# Patient Record
Sex: Male | Born: 1965 | Race: White | Hispanic: No | Marital: Single
Health system: Southern US, Community
[De-identification: ages and names within clinical notes are randomized; demographics above are authoritative.]

## PROBLEM LIST (undated history)

## (undated) DIAGNOSIS — Z8709 Personal history of other diseases of the respiratory system: Secondary | ICD-10-CM

## (undated) DIAGNOSIS — K4090 Unilateral inguinal hernia, without obstruction or gangrene, not specified as recurrent: Secondary | ICD-10-CM

## (undated) HISTORY — DX: Personal history of other diseases of the respiratory system: Z87.09

---

## 2006-10-08 ENCOUNTER — Emergency Department (HOSPITAL_COMMUNITY): Admission: EM | Admit: 2006-10-08 | Discharge: 2006-10-09 | Payer: Self-pay | Admitting: Emergency Medicine

## 2010-03-16 ENCOUNTER — Encounter: Payer: Self-pay | Admitting: Orthopedic Surgery

## 2010-12-04 LAB — RAPID URINE DRUG SCREEN, HOSP PERFORMED
Amphetamines: POSITIVE — AB
Barbiturates: NOT DETECTED
Benzodiazepines: POSITIVE — AB
Cocaine: POSITIVE — AB
Opiates: NOT DETECTED
Tetrahydrocannabinol: POSITIVE — AB

## 2010-12-04 LAB — ETHANOL: Alcohol, Ethyl (B): 60 — ABNORMAL HIGH

## 2010-12-04 LAB — BASIC METABOLIC PANEL
BUN: 5 — ABNORMAL LOW
CO2: 18 — ABNORMAL LOW
Calcium: 9.6
Creatinine, Ser: 1.17
GFR calc Af Amer: >60
Glucose, Bld: 87

## 2010-12-04 LAB — URINALYSIS, ROUTINE W REFLEX MICROSCOPIC
Glucose, UA: NEGATIVE
Hgb urine dipstick: NEGATIVE
Protein, ur: NEGATIVE

## 2019-05-31 ENCOUNTER — Ambulatory Visit: Payer: Self-pay | Attending: Internal Medicine

## 2019-05-31 DIAGNOSIS — Z23 Encounter for immunization: Secondary | ICD-10-CM

## 2019-05-31 NOTE — Progress Notes (Signed)
° °  Covid-19 Vaccination Clinic  Name:  Alan Fuentes    MRN: [id number] DOB: December 29, 1965  05/31/2019  Mr. Alan Fuentes was observed post Covid-19 immunization for 15 minutes without incident. He was provided with Vaccine Information Sheet and instruction to access the V-Safe system.   Mr. Alan Fuentes was instructed to call 911 with any severe reactions post vaccine:  Difficulty breathing   Swelling of face and throat   A fast heartbeat   A bad rash all over body   Dizziness and weakness   Immunizations Administered    Name Date Dose VIS Date Route   Pfizer COVID-19 Vaccine 05/31/2019  8:18 AM 0.3 mL 02/02/2019 Intramuscular   Manufacturer: [employer], [employer]   Lot: [id number]   NDC: [address]-5

## 2019-06-25 ENCOUNTER — Ambulatory Visit: Payer: Self-pay | Attending: Internal Medicine

## 2019-06-25 DIAGNOSIS — Z23 Encounter for immunization: Secondary | ICD-10-CM

## 2019-06-25 NOTE — Progress Notes (Signed)
   Covid-19 Vaccination Clinic  Name:  Alan Fuentes    MRN: [id number] DOB: 1965/07/23  06/25/2019  [name] was observed post Covid-19 immunization for 15 minutes without incident. He was provided with Vaccine Information Sheet and instruction to access the V-Safe system.   [name] was instructed to call 911 with any severe reactions post vaccine: Marland Kitchen Difficulty breathing  . Swelling of face and throat  . A fast heartbeat  . A bad rash all over body  . Dizziness and weakness   Immunizations Administered    Name Date Dose VIS Date Route   Pfizer COVID-19 Vaccine 06/25/2019  8:13 AM 0.3 mL 04/18/2018 Intramuscular   Manufacturer: [employer], [employer]   Lot: [id number]   NDC: [address]-4

## 2020-07-12 ENCOUNTER — Encounter (HOSPITAL_COMMUNITY): Payer: Self-pay | Admitting: Emergency Medicine

## 2020-07-12 ENCOUNTER — Emergency Department (HOSPITAL_COMMUNITY)
Admission: EM | Admit: 2020-07-12 | Discharge: 2020-07-13 | Disposition: A | Discharge status: Home / Self Care | Payer: Self-pay | Attending: Emergency Medicine | Admitting: Emergency Medicine

## 2020-07-12 ENCOUNTER — Other Ambulatory Visit: Payer: Self-pay

## 2020-07-12 DIAGNOSIS — R079 Chest pain, unspecified: Secondary | ICD-10-CM

## 2020-07-12 DIAGNOSIS — R000 Tachycardia, unspecified: Secondary | ICD-10-CM | POA: Insufficient documentation

## 2020-07-12 NOTE — ED Triage Notes (Signed)
Pt reports intermittent chest pain over the last 3 weeks. He though it was related to high blood pressure. He started BP meds earlier this week. Still experiencing the pain. States that it sometimes shoots down his L arm.

## 2020-07-13 ENCOUNTER — Emergency Department (HOSPITAL_COMMUNITY): Payer: Self-pay

## 2020-07-13 LAB — CBC
HCT: 41.7 % (ref 39.0–52.0)
Hemoglobin: 14.3 g/dL (ref 13.0–17.0)
MCH: 30.1 pg (ref 26.0–34.0)
MCHC: 34.3 g/dL (ref 30.0–36.0)
MCV: 87.8 fL (ref 80.0–100.0)
Platelets: 232 10*3/uL (ref 150–400)
RBC: 4.75 MIL/uL (ref 4.22–5.81)
RDW: 12.3 % (ref 11.5–15.5)
WBC: 10.4 10*3/uL (ref 4.0–10.5)
nRBC: 0 % (ref 0.0–0.2)

## 2020-07-13 LAB — TROPONIN I (HIGH SENSITIVITY): Troponin I (High Sensitivity): <2 ng/L (ref <18)

## 2020-07-13 LAB — BASIC METABOLIC PANEL
Anion gap: 7 (ref 5–15)
BUN: 12 mg/dL (ref 6–20)
CO2: 27 mmol/L (ref 22–32)
Calcium: 9.1 mg/dL (ref 8.9–10.3)
Chloride: 104 mmol/L (ref 98–111)
Creatinine, Ser: 0.86 mg/dL (ref 0.61–1.24)
GFR, Estimated: >60 mL/min (ref >60)
Glucose, Bld: 113 mg/dL — ABNORMAL HIGH (ref 70–99)
Potassium: 3.9 mmol/L (ref 3.5–5.1)
Sodium: 138 mmol/L (ref 135–145)

## 2020-07-13 LAB — D-DIMER, QUANTITATIVE: D-Dimer, Quant: 0.31 ug/mL-FEU (ref 0.00–0.50)

## 2020-07-13 NOTE — ED Provider Notes (Signed)
[facility]-EMERGENCY DEPT Provider Note   CSN: [id number] Arrival date & time: 07/12/20  2326     History Chief Complaint  Patient presents with  . Chest Pain    Alan Fuentes is a 55 y.o. male.  Patient presents chief complaint of chest pain.  Described as left-sided chest pain sharp in nature and lasts about 3 seconds and then resolves.  Currently denies any chest pain at rest at this time but had it earlier today.  Has had this pain off and on for the past week.  Denies shortness of breath denies palpitations denies diaphoresis.        History reviewed. No pertinent past medical history.  There are no problems to display for this patient.   History reviewed. No pertinent surgical history.     History reviewed. No pertinent family history.     Home Medications Prior to Admission medications   Not on File    Allergies    Patient has no known allergies.  Review of Systems   Review of Systems  Constitutional: Negative for fever.  HENT: Negative for ear pain and sore throat.   Eyes: Negative for pain.  Respiratory: Negative for cough.   Cardiovascular: Positive for chest pain.  Gastrointestinal: Negative for abdominal pain.  Genitourinary: Negative for flank pain.  Musculoskeletal: Negative for back pain.  Skin: Negative for color change and rash.  Neurological: Negative for syncope.  All other systems reviewed and are negative.   Physical Exam Updated Vital Signs BP (!) 149/91   Pulse 93   Temp 98.1 F (36.7 C) (Oral)   Resp 15   Ht 5\' 9"  (1.753 m)   Wt 77.1 kg   SpO2 99%   BMI 25.10 kg/m   Physical Exam Constitutional:      General: He is not in acute distress.    Appearance: He is well-developed.  HENT:     Head: Normocephalic.     Nose: Nose normal.  Eyes:     Extraocular Movements: Extraocular movements intact.  Cardiovascular:     Rate and Rhythm: Tachycardia present.  Pulmonary:     Effort: Pulmonary  effort is normal.  Musculoskeletal:     Right lower leg: No edema.     Left lower leg: No edema.  Skin:    Coloration: Skin is not jaundiced.  Neurological:     Mental Status: He is alert. Mental status is at baseline.     ED Results / Procedures / Treatments   Labs (all labs ordered are listed, but only abnormal results are displayed) Labs Reviewed  BASIC METABOLIC PANEL - Abnormal; Notable for the following components:      Result Value   Glucose, Bld 113 (*)    All other components within normal limits  CBC  D-DIMER, QUANTITATIVE  TROPONIN I (HIGH SENSITIVITY)    EKG EKG Interpretation  Date/Time:  Sunday Jul 13 2020 00:00:14 EDT Ventricular Rate:  115 PR Interval:  114 QRS Duration: 64 QT Interval:  375 QTC Calculation: 519 R Axis:   59 Text Interpretation: Sinus tachycardia Borderline T abnormalities, diffuse leads Prolonged QT interval 12 Lead; Mason-Likar Confirmed by [id number] ([id number]) on 07/13/2020 12:04:33 AM   Radiology DG Chest 2 View  Result Date: 07/13/2020 CLINICAL DATA:  Chest pain x3 weeks. EXAM: CHEST - 2 VIEW COMPARISON:  None. FINDINGS: The heart size and mediastinal contours are within normal limits. Both lungs are clear. Multilevel degenerative changes seen throughout the  thoracic spine. IMPRESSION: No active cardiopulmonary disease. Electronically Signed   By: Aram Candela M.D.   On: 07/13/2020 00:20    Procedures Procedures   Medications Ordered in ED Medications - No data to display  ED Course  I have reviewed the triage vital signs and the nursing notes.  Pertinent labs & imaging results that were available during my care of the patient were reviewed by me and considered in my medical decision making (see chart for details).    MDM Rules/Calculators/A&P                          On exam patient is anxious appearing with slightly pressured speech.  He is mildly tachycardic.  Chest x-ray is unremarkable EKG is unremarkable no ST  elevations no C depressions in sinus rhythm mild tachycardia.  Troponin is negative.  D-dimer is also negative.  Given negative work-up I doubt acute coronary syndrome or other emergent pathology.  Will recommend outpatient follow-up with cardiology within the week, recommending immediate return for difficulty breathing worsening symptoms worsening pain or any additional concerns.  Final Clinical Impression(s) / ED Diagnoses Final diagnoses:  Chest pain, unspecified type    Rx / DC Orders ED Discharge Orders    None       Cheryll Cockayne, MD 07/13/20 [phone]

## 2020-07-13 NOTE — Discharge Instructions (Addendum)
Call your primary care doctor or specialist as discussed in the next 2-3 days.   Return immediately back to the ER if:  Your symptoms worsen within the next 12-24 hours. You develop new symptoms such as new fevers, persistent vomiting, new pain, shortness of breath, or new weakness or numbness, or if you have any other concerns.  

## 2020-07-26 ENCOUNTER — Emergency Department (HOSPITAL_COMMUNITY)
Admission: EM | Admit: 2020-07-26 | Discharge: 2020-07-26 | Disposition: A | Discharge status: Home / Self Care | Payer: Self-pay | Attending: Emergency Medicine | Admitting: Emergency Medicine

## 2020-07-26 ENCOUNTER — Other Ambulatory Visit: Payer: Self-pay

## 2020-07-26 DIAGNOSIS — R002 Palpitations: Secondary | ICD-10-CM | POA: Insufficient documentation

## 2020-07-26 DIAGNOSIS — I10 Essential (primary) hypertension: Secondary | ICD-10-CM | POA: Insufficient documentation

## 2020-07-26 DIAGNOSIS — R9431 Abnormal electrocardiogram [ECG] [EKG]: Secondary | ICD-10-CM | POA: Insufficient documentation

## 2020-07-26 DIAGNOSIS — R000 Tachycardia, unspecified: Secondary | ICD-10-CM | POA: Insufficient documentation

## 2020-07-26 LAB — BASIC METABOLIC PANEL
Anion gap: 7 (ref 5–15)
BUN: 15 mg/dL (ref 6–20)
CO2: 25 mmol/L (ref 22–32)
Calcium: 9.1 mg/dL (ref 8.9–10.3)
Chloride: 105 mmol/L (ref 98–111)
Creatinine, Ser: 0.79 mg/dL (ref 0.61–1.24)
GFR, Estimated: >60 mL/min (ref >60)
Glucose, Bld: 113 mg/dL — ABNORMAL HIGH (ref 70–99)
Potassium: 3.8 mmol/L (ref 3.5–5.1)
Sodium: 137 mmol/L (ref 135–145)

## 2020-07-26 LAB — CBC WITH DIFFERENTIAL/PLATELET
Abs Immature Granulocytes: 0.02 10*3/uL (ref 0.00–0.07)
Basophils Absolute: 0 10*3/uL (ref 0.0–0.1)
Basophils Relative: 0 %
Eosinophils Absolute: 0.1 10*3/uL (ref 0.0–0.5)
Eosinophils Relative: 1 %
HCT: 41.6 % (ref 39.0–52.0)
Hemoglobin: 14.4 g/dL (ref 13.0–17.0)
Immature Granulocytes: 0 %
Lymphocytes Relative: 24 %
Lymphs Abs: 2.2 10*3/uL (ref 0.7–4.0)
MCH: 30.5 pg (ref 26.0–34.0)
MCHC: 34.6 g/dL (ref 30.0–36.0)
MCV: 88.1 fL (ref 80.0–100.0)
Monocytes Absolute: 0.5 10*3/uL (ref 0.1–1.0)
Monocytes Relative: 5 %
Neutro Abs: 6.4 10*3/uL (ref 1.7–7.7)
Neutrophils Relative %: 70 %
Platelets: 223 10*3/uL (ref 150–400)
RBC: 4.72 MIL/uL (ref 4.22–5.81)
RDW: 12.1 % (ref 11.5–15.5)
WBC: 9.2 10*3/uL (ref 4.0–10.5)
nRBC: 0 % (ref 0.0–0.2)

## 2020-07-26 MED ORDER — PROPRANOLOL HCL 10 MG PO TABS: 10.0000 mg | ORAL_TABLET | ORAL | 0 refills | Status: DC | PRN

## 2020-07-26 NOTE — ED Provider Notes (Signed)
[facility]-EMERGENCY DEPT Provider Note   CSN: [id number] Arrival date & time: 07/26/20  1445     History Chief Complaint  Patient presents with  . Tachycardia    Alan Fuentes is a 55 y.o. male.  Alan Fuentes states that he was sitting at his desk today when he suddenly started to feel palpitations.  When he feels his heart rate speeding up, he becomes very nervous and starts to check his pulse frequently.  He tries to do some breathing exercises.  However, he ultimately felt that he needed to be evaluated.  He stood outside the emergency department for several minutes hoping that just the proximity to the ER would help his symptoms.  However, his high heart rate persisted, and he sought evaluation.  He recently had a cardiac work-up for a similar episode that was associated with chest pain.  This episode was not associated with any chest pain.  He has a cardiology appointment, but it is not until August 19.  He states that he also saw a telehealth provider, and he was started on something for his blood pressure.  He does endorse some anxiety, and he has known close friends and family who have died suddenly.  He used to use marijuana but thought it worsened symptoms, and he no longer uses that.  He is physically fit, and he states that he is able to exercise without any difficulty.  The history is provided by the patient.  Palpitations Palpitations quality:  Fast Onset quality:  Sudden Duration: roughly an hour or more. Timing:  Constant Progression:  Resolved Chronicity:  Recurrent Context comment:  Unsure what triggers the feelings Relieved by:  Nothing Worsened by:  Nothing Ineffective treatments:  Breathing exercises Associated symptoms: no back pain, no chest pain, no cough, no diaphoresis, no dizziness, no near-syncope, no shortness of breath and no vomiting        No past medical history on file.  There are no problems to display for this  patient.     No family history on file.     Home Medications Prior to Admission medications   Not on File    Allergies    Patient has no allergy information on record.  Review of Systems   Review of Systems  Constitutional: Negative for chills, diaphoresis and fever.  HENT: Negative for ear pain and sore throat.   Eyes: Negative for pain and visual disturbance.  Respiratory: Negative for cough and shortness of breath.   Cardiovascular: Positive for palpitations. Negative for chest pain and near-syncope.  Gastrointestinal: Negative for abdominal pain and vomiting.  Genitourinary: Negative for dysuria and hematuria.  Musculoskeletal: Negative for arthralgias and back pain.  Skin: Negative for color change and rash.  Neurological: Negative for dizziness, seizures and syncope.  All other systems reviewed and are negative.   Physical Exam Updated Vital Signs BP (!) 158/106 (BP Location: Right Arm)   Pulse 87   Temp 98.6 F (37 C) (Oral)   Resp 17   SpO2 100%   Physical Exam Vitals and nursing note reviewed.  Constitutional:      Appearance: He is well-developed.  HENT:     Head: Normocephalic and atraumatic.  Eyes:     Conjunctiva/sclera: Conjunctivae normal.  Cardiovascular:     Rate and Rhythm: Normal rate and regular rhythm.     Heart sounds: No murmur heard.   Pulmonary:     Effort: Pulmonary effort is normal. No respiratory distress.  Breath sounds: Normal breath sounds.  Musculoskeletal:        General: No deformity.     Cervical back: Neck supple.  Skin:    General: Skin is warm and dry.  Neurological:     General: No focal deficit present.     Mental Status: He is alert.  Psychiatric:        Mood and Affect: Mood normal.        Behavior: Behavior normal.     ED Results / Procedures / Treatments   Labs (all labs ordered are listed, but only abnormal results are displayed) Labs Reviewed  BASIC METABOLIC PANEL - Abnormal; Notable for the  following components:      Result Value   Glucose, Bld 113 (*)    All other components within normal limits  CBC WITH DIFFERENTIAL/PLATELET    EKG EKG Interpretation  Date/Time:  Saturday July 26 2020 14:54:13 EDT Ventricular Rate:  119 PR Interval:  122 QRS Duration: 86 QT Interval:  395 QTC Calculation: 556 R Axis:   56 Text Interpretation: Sinus tachycardia Atrial premature complex Inferior infarct, age indeterminate Lateral leads are also involved Prolonged QT interval 12 Lead; Mason-Likar Confirmed by Alan Fuentes ([id number]) on 07/26/2020 5:03:44 PM   Radiology No results found.  Procedures Procedures   Medications Ordered in ED Medications - No data to display  ED Course  I have reviewed the triage vital signs and the nursing notes.  Pertinent labs & imaging results that were available during my care of the patient were reviewed by me and considered in my medical decision making (see chart for details).    MDM Rules/Calculators/A&P                          Alan Fuentes presented with palpitations.  EKG did reveal sinus tachycardia, but by the time I saw him, his heart rate was in a normal range.  He was hypertensive, and he has just started a blood pressure medication.  I did not order a cardiac work-up because he denied any chest pain.  Furthermore, he had just had an extensive cardiac work-up here.  EKG also revealed prolonged QTc interval, but this was not present on his prior EKG.  I am reassured that he has a cardiac follow-up.  I think there is a possibility this is panic related, but I am also concerned about an intermittent arrhythmia.  I gave him some propranolol to use as needed, and I encouraged him to return if he worsens. Final Clinical Impression(s) / ED Diagnoses Final diagnoses:  Palpitations  Hypertension, unspecified type  Prolonged Q-T interval on ECG    Rx / DC Orders ED Discharge Orders         Ordered    propranolol (INDERAL) 10 MG tablet  As  needed        07/26/20 2034           Alan Distance, MD 07/26/20 2104

## 2020-07-26 NOTE — ED Provider Notes (Signed)
Emergency Medicine Provider Triage Evaluation Note  Alan Fuentes , a 55 y.o. male  was evaluated in triage.  Pt complains of feeling like his heart is racing.  Symptoms have been going on for the past 2 weeks.  He was referred to cardiology but cannot see them for another 6 weeks.  He denies any chest pain or shortness of breath but he is unsure what is causing his high heart rate.  He is unsure if he is anxious or if this is causing him to be anxious.  Denies any leg swelling or history of DVT or PE.  Review of Systems  Positive: Palpitation Negative: Chest pain, shortness of breath  Physical Exam  BP (!) 168/106 (BP Location: Right Arm)   Pulse (!) 119   Temp 98.6 F (37 C) (Oral)   Resp 16   SpO2 100%  Gen:   Awake, no distress   Resp:  Normal effort  MSK:   Moves extremities without difficulty  Other:  Tachycardic  Medical Decision Making  Medically screening exam initiated at 3:18 PM.  Appropriate orders placed.  Alan Fuentes was informed that the remainder of the evaluation will be completed by another provider, this initial triage assessment does not replace that evaluation, and the importance of remaining in the ED until their evaluation is complete.  Will order blood work and   Dietrich Pates, Cordelia Poche 07/26/20 1519    Lorre Nick, MD 07/27/20 [phone]

## 2020-07-26 NOTE — ED Triage Notes (Signed)
Patient reports intermittent increased heart rate for two weeks. He reports being seen here two weeks ago for chest pain/tachycardia and was d/c with a referral for cardiology. He states he was told that it was most likely anxiety. He reports he took half a xanax today which has not helped. He reports checking his HR at home with a device at home frequently. He denies chest pain at this time. He denies ever being diagnosed with anxiety or depression.

## 2020-07-29 ENCOUNTER — Telehealth: Payer: Self-pay | Admitting: *Deleted

## 2020-07-31 ENCOUNTER — Telehealth: Payer: Self-pay | Admitting: Cardiology

## 2020-07-31 NOTE — Telephone Encounter (Signed)
New Message:   Pt was seen in the ER and referred to [place]. He does not have enough medicine until his appt on 7-16. He called the ER and they told him to get a prescription from provider that he was going to see.      *STAT* If patient is at the pharmacy, call can be transferred to refill team.   1. Which medications need to be refilled? (please list name of each medication and dose if known)  a new prescription for Propanolol  2. Which pharmacy/location (including street and city if local pharmacy) is medication to be sent to? [facility] [address]  3. Do they need a 30 day or 90 day supply?  Enough until  his appt on 09-06-20

## 2020-08-01 MED ORDER — PROPRANOLOL HCL 10 MG PO TABS: 10.0000 mg | ORAL_TABLET | Freq: Two times a day (BID) | ORAL | 0 refills | Status: DC | PRN

## 2020-08-01 NOTE — Telephone Encounter (Signed)
Ok to fill 

## 2020-08-01 NOTE — Telephone Encounter (Signed)
Per Dr. Cristal Deer, ok to send new Rx for propanolol 10 mg BID PRN.

## 2020-09-08 NOTE — Progress Notes (Signed)
Cardiology Office Note:    Date:  09/10/2020   ID:  Alan Fuentes, DOB 1965/05/31, MRN [id number]  PCP:  Pcp, No  Cardiologist:  Jodelle Red, MD  Referring MD: Cheryll Cockayne, MD   CC: new patient evaluation, post ER follow up   History of Present Illness:    [name] is a 55 y.o. male with a hx of asthma who is seen as a new consult at the request of [place], Eustace Moore, MD for the evaluation and management of palpitations .  Today: We reviewed his recent ER visits together. He does not endorse chest pain, but does endorse racing heartbeats. This has happened in the past, but recently has been more severe. Notes that stress/anxiety worsens the symptoms, and they have resolved since stopping THC use (smoking and edibles).   Risk factors: -Alcohol: None -Tobacco: prior marijuana use (now abstinent), Never smoked cigarettes -Comorbidities: Asthma -Exercise level: Previously used to box, running. Asks if he is able to resume exercising. -Cardiac ROS: +shortness of breath, no PND, no orthopnea, no LE edema, no syncope -Family history: Father had heart attack at 106 yo, had CABG x3, rheumatic fever as child. Mother had valve replacement a few years ago. Maternal grandmother died of lung cancer, no cardiac issues. Maternal great-grandmother had a stroke at 44 yo. Has siblings with no health issues.  Lately, he is experiencing episodes of racing heart rate/palpitations. Correlating symptoms include shortness of breath, and his palpitations will worsen with stress and anxiety. 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  He had asthma as a child, and therefore waited before first visiting the ED for the palpitations and shortness of breath. Currently he does not take medication for asthma. After visiting the ED, taking propranolol is working well with relieving his palpitations and related symptoms. Occasionally he takes an extra 1/2  tablet.  For hypertension, he was prescribed 25 mg Losartan. At this time he is not taking this. At home, his blood pressure averages 130s-140s and his HR averages in the 70s.  For his diet, he has not eaten meat for the past 1.5 years.   While he was young, he pinched a nerve in his diaphragm. Occasionally he feels pain associated with this.  Previously he was taking an OTC testosterone supplement.  He denies any exertional symptoms, headaches, lightheadedness, or syncope. Also has no lower extremity edema, orthopnea or PND.   Past Medical History:  Diagnosis Date   History of asthma    as a child    History reviewed. No pertinent surgical history.  Current Medications: No current outpatient medications on file prior to visit.   No current facility-administered medications on file prior to visit.     Allergies:   Patient has no known allergies.   Social History   Tobacco Use   Smoking status: Never   Smokeless tobacco: Never  Substance Use Topics   Alcohol use: Never   Drug use: Not Currently    Types: Marijuana    Family History: family history includes Congestive Heart Failure in his father; Heart attack in his father; Lung cancer in his maternal grandmother; Rheumatic fever in his father; Valvular heart disease in his mother.  ROS:   Please see the history of present illness.  Additional pertinent ROS: Constitutional: Negative for chills, fever, night sweats, unintentional weight loss  HENT: Negative for ear pain and hearing loss.   Eyes: Negative for loss  of vision and eye pain.  Respiratory: Positive for shortness of breath. Negative for cough, sputum, wheezing.   Cardiovascular: See HPI. Gastrointestinal: Negative for abdominal pain, melena, and hematochezia.  Genitourinary: Negative for dysuria and hematuria.  Musculoskeletal: Negative for falls and myalgias.  Skin: Negative for itching and rash.  Neurological: Negative for focal weakness, focal sensory  changes and loss of consciousness.  Endo/Heme/Allergies: Does not bruise/bleed easily.     EKGs/Labs/Other Studies Reviewed:    The following studies were reviewed today: No prior CV studies available.  EKG:  EKG is personally reviewed.   09/09/2020: normal sinus rhythm at 82 bpm  Recent Labs: 07/26/2020: BUN 15; Creatinine, Ser 0.79; Hemoglobin 14.4; Platelets 223; Potassium 3.8; Sodium 137  Recent Lipid Panel No results found for: CHOL, TRIG, HDL, CHOLHDL, VLDL, LDLCALC, LDLDIRECT  Physical Exam:    VS:  BP (!) 160/98   Pulse 82   Ht 5\' 9"  (1.753 m)   Wt 173 lb 6.4 oz (78.7 kg)   BMI 25.61 kg/m     Wt Readings from Last 3 Encounters:  09/09/20 173 lb 6.4 oz (78.7 kg)  07/12/20 170 lb (77.1 kg)    GEN: Well nourished, well developed in no acute distress HEENT: Normal, moist mucous membranes NECK: No JVD CARDIAC: regular rhythm, normal S1 and S2, no rubs or gallops. No murmur. VASCULAR: Radial and DP pulses 2+ bilaterally. No carotid bruits RESPIRATORY:  Clear to auscultation without rales, wheezing or rhonchi  ABDOMEN: Soft, non-tender, non-distended MUSCULOSKELETAL:  Ambulates independently SKIN: Warm and dry, no edema NEUROLOGIC:  Alert and oriented x 3. No focal neuro deficits noted. PSYCHIATRIC:  Normal affect    ASSESSMENT:    1. Heart palpitations   2. Essential hypertension   3. Cardiac risk counseling   4. Counseling on health promotion and disease prevention   5. Family history of heart disease   6. Medication management    PLAN:    Palpitations: -now resolved with cessation of THC use -discussed potential triggers, etiologies today -as symptoms have resolved, will hold on monitor/echo for now, he will contact me if they recur -he also started propranolol at the same time he stopped THC, so this may also help his symptoms. However, he would prefer daily dosing. Will change propranolol to metoprolol succinate today  Hypertension: -prescribed losartan  but not taking currently -reports home numbers are better but not ideal -will monitor at home. We are changing beta blocker, but I do not expect significant BP improvement on this. But we will change one medication at a time. Does not currently have insurance, so will aim for low cost generic meds to start if agents needed at follow up  family history of heart disease: -checking lipids, lp(a) today  Cardiac risk counseling and prevention recommendations:  -recommend heart healthy/Mediterranean diet, with whole grains, fruits, vegetable, fish, lean meats, nuts, and olive oil. Limit salt. -recommend moderate walking, 3-5 times/week for 30-50 minutes each session. Aim for at least 150 minutes.week. Goal should be pace of 3 miles/hours, or walking 1.5 miles in 30 minutes -recommend avoidance of tobacco products. Avoid excess alcohol. -ASCVD risk score: The ASCVD Risk score 07/14/20 DC Jr., et al., 2013) failed to calculate for the following reasons:   Cannot find a previous HDL lab   Cannot find a previous total cholesterol lab     Plan for follow up: 2 months or sooner as needed.  2014, MD, PhD, [facility] [place]  [facility]  Medication Adjustments/Labs and Tests Ordered: Current medicines are reviewed at length with the patient today.  Concerns regarding medicines are outlined above.  Orders Placed This Encounter  Procedures   Lipid panel   Lipoprotein A (LPA)   EKG 12-Lead    Meds ordered this encounter  Medications   metoprolol succinate (TOPROL-XL) 25 MG 24 hr tablet    Sig: Take 1 tablet (25 mg total) by mouth daily. Take with or immediately following a meal.    Dispense:  90 tablet    Refill:  3     Patient Instructions  Medication Instructions:  Stop taking Propranolol 10 mg Start taking Metoprolol 25 mg daily  *If you need a refill on your cardiac medications before your next appointment, please call your pharmacy*   Lab Work: Dr. Cristal Deer  recommends lab work at our [facility] ([address] [place], [place] [address], no lab appointment needed)  -Fasting Lipid -LPa    If you have labs (blood work) drawn today and your tests are completely normal, you will receive your results only by: MyChart Message (if you have MyChart) OR A paper copy in the mail If you have any lab test that is abnormal or we need to change your treatment, we will call you to review the results.   Testing/Procedures: None ordered today   Follow-Up: At [facility], you and your health needs are our priority.  As part of our continuing mission to provide you with exceptional heart care, we have created designated Provider Care Teams.  These Care Teams include your primary Cardiologist (physician) and Advanced Practice Providers (APPs -  Physician Assistants and Nurse Practitioners) who all work together to provide you with the care you need, when you need it.  We recommend signing up for the patient portal called "MyChart".  Sign up information is provided on this After Visit Summary.  MyChart is used to connect with patients for Virtual Visits (Telemedicine).  Patients are able to view lab/test results, encounter notes, upcoming appointments, etc.  Non-urgent messages can be sent to your provider as well.   To learn more about what you can do with MyChart, go to [email or web address].    Your next appointment:   2 month(s)  The format for your next appointment:   In Person  Provider:   Jodelle Red, MD     [facility] Stump[place],acting as a scribe for Jodelle Red, MD.,have documented all relevant documentation on the behalf of Jodelle Red, MD,as directed by  Jodelle Red, MD while in the presence of Jodelle Red, MD.  I, Jodelle Red, MD, have reviewed all documentation for this visit. The documentation on 09/10/20 for the exam, diagnosis, procedures, and orders are all accurate and  complete.   Signed, Jodelle Red, MD PhD 09/10/2020 8:25 AM    [facility]

## 2020-09-09 ENCOUNTER — Other Ambulatory Visit: Payer: Self-pay

## 2020-09-09 ENCOUNTER — Encounter (HOSPITAL_BASED_OUTPATIENT_CLINIC_OR_DEPARTMENT_OTHER): Payer: Self-pay | Admitting: Cardiology

## 2020-09-09 ENCOUNTER — Ambulatory Visit (INDEPENDENT_AMBULATORY_CARE_PROVIDER_SITE_OTHER): Payer: Self-pay | Admitting: Cardiology

## 2020-09-09 VITALS — BP 160/98 | HR 82 | Ht 69.0 in | Wt 173.4 lb

## 2020-09-09 DIAGNOSIS — Z8249 Family history of ischemic heart disease and other diseases of the circulatory system: Secondary | ICD-10-CM

## 2020-09-09 DIAGNOSIS — R002 Palpitations: Secondary | ICD-10-CM | POA: Insufficient documentation

## 2020-09-09 DIAGNOSIS — Z7189 Other specified counseling: Secondary | ICD-10-CM

## 2020-09-09 DIAGNOSIS — Z79899 Other long term (current) drug therapy: Secondary | ICD-10-CM

## 2020-09-09 DIAGNOSIS — I10 Essential (primary) hypertension: Secondary | ICD-10-CM | POA: Insufficient documentation

## 2020-09-09 MED ORDER — METOPROLOL SUCCINATE ER 25 MG PO TB24: 25.0000 mg | ORAL_TABLET | Freq: Every day | ORAL | 3 refills | Status: DC

## 2020-09-09 NOTE — Patient Instructions (Signed)
Medication Instructions:  Stop taking Propranolol 10 mg Start taking Metoprolol 25 mg daily  *If you need a refill on your cardiac medications before your next appointment, please call your pharmacy*   Lab Work: Dr. Cristal Deer recommends lab work at our [facility] ([address], [place] [address], no lab appointment needed)  -Fasting Lipid -LPa    If you have labs (blood work) drawn today and your tests are completely normal, you will receive your results only by: MyChart Message (if you have MyChart) OR A paper copy in the mail If you have any lab test that is abnormal or we need to change your treatment, we will call you to review the results.   Testing/Procedures: None ordered today   Follow-Up: At [facility], you and your health needs are our priority.  As part of our continuing mission to provide you with exceptional heart care, we have created designated Provider Care Teams.  These Care Teams include your primary Cardiologist (physician) and Advanced Practice Providers (APPs -  Physician Assistants and Nurse Practitioners) who all work together to provide you with the care you need, when you need it.  We recommend signing up for the patient portal called "MyChart".  Sign up information is provided on this After Visit Summary.  MyChart is used to connect with patients for Virtual Visits (Telemedicine).  Patients are able to view lab/test results, encounter notes, upcoming appointments, etc.  Non-urgent messages can be sent to your provider as well.   To learn more about what you can do with MyChart, go to [email or web address].    Your next appointment:   2 month(s)  The format for your next appointment:   In Person  Provider:   Jodelle Red, MD

## 2020-09-15 ENCOUNTER — Encounter (HOSPITAL_BASED_OUTPATIENT_CLINIC_OR_DEPARTMENT_OTHER): Payer: Self-pay

## 2020-09-16 ENCOUNTER — Other Ambulatory Visit: Payer: Self-pay | Admitting: Cardiology

## 2020-09-18 LAB — LIPID PANEL
Chol/HDL Ratio: 4.9 ratio (ref 0.0–5.0)
Cholesterol, Total: 205 mg/dL — ABNORMAL HIGH (ref 100–199)
HDL: 42 mg/dL (ref >39)
LDL Chol Calc (NIH): 138 mg/dL — ABNORMAL HIGH (ref 0–99)
Triglycerides: 140 mg/dL (ref 0–149)
VLDL Cholesterol Cal: 25 mg/dL (ref 5–40)

## 2020-09-18 LAB — LIPOPROTEIN A (LPA): Lipoprotein (a): 18.1 nmol/L (ref <75.0)

## 2020-09-30 ENCOUNTER — Encounter (HOSPITAL_BASED_OUTPATIENT_CLINIC_OR_DEPARTMENT_OTHER): Payer: Self-pay

## 2020-11-10 NOTE — Progress Notes (Signed)
Cardiology Office Note:    Date:  11/11/2020   ID:  Alan Fuentes, DOB July 28, 1965, MRN [id number]  PCP:  Pcp, No  Cardiologist:  Buford Dresser, MD  CC: follow up   History of Present Illness:    Alan Fuentes is a 55 y.o. male with a hx of asthma who is seen for follow-up. I initially met him 09/09/2020 for the evaluation and management of palpitations .  Today: He appears well and presents a blood pressure log, beginning with one reading a day and then two daily. On average his readings have been in the 100s/60-70s when he wakes up. He has started drinking beet juice, and believes this has helped drop his blood pressure by 10 points.  Typically when his heart rate begins to rise above 120 bpm he will slow down while running. When he first ran 2 miles, his heart rate did not slow as quickly as usual, but this resolved quickly. This did not recur. He only experiences shortness of breath with heavy exertion, which he expects.  For exercise he is running every day. Now he has worked up to 2 miles. For his diet, he has successfully cut back on candy. He continues to drink soda regularly but plans to work on this. Also he is eating Magic Spoon cereal, which is high-protein and low-carb.  If he sleeps on his back, he endorses sensation of apnea due to a deviated septum. He sleeps normally if on his side. Asking if OTC sleep monitor is helpful for detection/treatment of apnea.  He denies any palpitations, or chest pain. No lightheadedness, headaches, syncope, or PND. Also has no lower extremity edema.   Past Medical History:  Diagnosis Date   History of asthma    as a child    History reviewed. No pertinent surgical history.  Current Medications: Current Outpatient Medications on File Prior to Visit  Medication Sig   metoprolol succinate (TOPROL-XL) 25 MG 24 hr tablet Take 1 tablet (25 mg total) by mouth daily. Take with or immediately following a meal.   No current  facility-administered medications on file prior to visit.     Allergies:   Patient has no known allergies.   Social History   Tobacco Use   Smoking status: Never   Smokeless tobacco: Never  Substance Use Topics   Alcohol use: Never   Drug use: Not Currently    Types: Marijuana    Family History: family history includes Congestive Heart Failure in his father; Heart attack in his father; Lung cancer in his maternal grandmother; Rheumatic fever in his father; Valvular heart disease in his mother. Father had heart attack at 23 yo, had CABG x3, rheumatic fever as child. Mother had valve replacement a few years ago. Maternal grandmother died of lung cancer, no cardiac issues. Maternal great-grandmother had a stroke at 39 yo. Has siblings with no health issues.  ROS:   Please see the history of present illness. All other systems are reviewed and negative.    EKGs/Labs/Other Studies Reviewed:    The following studies were reviewed today: No prior CV studies available.  EKG:  EKG is personally reviewed.   11/11/2020: not ordered 09/09/2020: normal sinus rhythm at 82 bpm  Recent Labs: 07/26/2020: BUN 15; Creatinine, Ser 0.79; Hemoglobin 14.4; Platelets 223; Potassium 3.8; Sodium 137  Recent Lipid Panel    Component Value Date/Time   CHOL 205 (H) 09/17/2020 0820   TRIG 140 09/17/2020 0820   HDL 42 09/17/2020 0820  CHOLHDL 4.9 09/17/2020 0820   LDLCALC 138 (H) 09/17/2020 0820    Physical Exam:    VS:  BP 136/86 (BP Location: Right Arm, Patient Position: Sitting, Cuff Size: Normal)   Pulse 65   Ht 5' 9"  (1.753 m)   Wt 173 lb 6.4 oz (78.7 kg)   SpO2 99%   BMI 25.61 kg/m     Wt Readings from Last 3 Encounters:  11/11/20 173 lb 6.4 oz (78.7 kg)  09/09/20 173 lb 6.4 oz (78.7 kg)  07/12/20 170 lb (77.1 kg)    GEN: Well nourished, well developed in no acute distress HEENT: Normal, moist mucous membranes NECK: No JVD CARDIAC: regular rhythm, normal S1 and S2, no rubs or  gallops. No murmur. VASCULAR: Radial and DP pulses 2+ bilaterally. No carotid bruits RESPIRATORY:  Clear to auscultation without rales, wheezing or rhonchi  ABDOMEN: Soft, non-tender, non-distended MUSCULOSKELETAL:  Ambulates independently SKIN: Warm and dry, no edema NEUROLOGIC:  Alert and oriented x 3. No focal neuro deficits noted. PSYCHIATRIC:  Normal affect    ASSESSMENT:    1. Medication management   2. Heart palpitations   3. Family history of heart disease   4. Cardiac risk counseling   5. Counseling on health promotion and disease prevention   6. 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 Wants to recheck lipids given changes before discussing treatment options. Not fasting today, will order fasting lipids.  Cardiac risk counseling and prevention recommendations:  -recommend heart healthy/Mediterranean diet, with whole grains, fruits, vegetable, fish, lean meats, nuts, and olive oil. Limit salt. -recommend moderate walking, 3-5 times/week for 30-50 minutes each session. Aim for at least 150 minutes.week. Goal should be pace of 3 miles/hours, or walking 1.5 miles in 30 minutes -recommend avoidance of tobacco products. Avoid excess alcohol. -ASCVD risk score: The 10-year ASCVD risk score (Arnett DK, et al., 2019) is: 10.2%   Values used to calculate the score:     Age: 68 years     Sex: Male     Is Non-Hispanic African American: No     Diabetic: No     Tobacco smoker: No     Systolic Blood Pressure: 734 mmHg     Is BP treated:  Yes     HDL Cholesterol: 42 mg/dL     Total Cholesterol: 205 mg/dL     Plan for follow up: 1 year or sooner as needed.  Buford Dresser, MD, PhD, [facility]    Medication Adjustments/Labs and Tests Ordered: Current medicines are reviewed at length with the patient today.  Concerns regarding medicines are outlined above.   Orders Placed This Encounter  Procedures   Lipid panel    No orders of the defined types were placed in this encounter.  Patient Instructions  Medication Instructions:  Your Physician recommend you continue on your current medication as directed.    *If you need a refill on your cardiac medications before your next appointment, please call your pharmacy*   Lab Work: Your provider has recommended lab work. Please have this collected  at [facility] at [place]. The lab is open 8:00 am - 4:30 pm. Please avoid 12:00p - 1:00p for lunch hour. You do not need an appointment. Please go to [phone] [place] [employer], [place] [address]. This is in the Primary Care office on the 3rd floor, let them know you are there for blood work and they will direct you to the lab.  -fasting lipid   If you have labs (blood work) drawn today and your tests are completely normal, you will receive your results only by: [place] (if you have MyChart) OR A paper copy in the mail If you have any lab test that is abnormal or we need to change your treatment, we will call you to review the results.   Testing/Procedures: None ordered today   Follow-Up: At [facility], you and your health needs are our priority.  As part of our continuing mission to provide you with exceptional heart care, we have created designated Provider Care Teams.  These Care Teams include your primary Cardiologist (physician) and Advanced Practice Providers (APPs -  Physician Assistants and Nurse Practitioners) who all work together to provide you with the  care you need, when you need it.  We recommend signing up for the patient portal called "MyChart".  Sign up information is provided on this After Visit Summary.  MyChart is used to connect with patients for Virtual Visits (Telemedicine).  Patients are able to view lab/test results, encounter notes, upcoming appointments, etc.  Non-urgent messages can be sent to your provider as well.   To learn more about what you can do with MyChart, go to [email or web address].    Your next appointment:   1 year(s)  The format for your next appointment:   In Person  Provider:   Buford Dresser, MD     [facility] Stumpf,acting as a scribe for Buford Dresser, MD.,have documented all relevant documentation on the behalf of Buford Dresser, MD,as directed by  Buford Dresser, MD while in the presence of Buford Dresser, MD.  I, Buford Dresser, MD, have reviewed all documentation for this visit. The documentation on 11/11/20 for the exam, diagnosis, procedures, and orders are all accurate and complete.   Signed, Buford Dresser, MD PhD 11/11/2020 8:25 AM    [place]

## 2020-11-11 ENCOUNTER — Ambulatory Visit (INDEPENDENT_AMBULATORY_CARE_PROVIDER_SITE_OTHER): Payer: Self-pay | Admitting: Cardiology

## 2020-11-11 ENCOUNTER — Encounter (HOSPITAL_BASED_OUTPATIENT_CLINIC_OR_DEPARTMENT_OTHER): Payer: Self-pay | Admitting: Cardiology

## 2020-11-11 ENCOUNTER — Other Ambulatory Visit: Payer: Self-pay

## 2020-11-11 VITALS — BP 136/86 | HR 65 | Ht 69.0 in | Wt 173.4 lb

## 2020-11-11 DIAGNOSIS — Z8249 Family history of ischemic heart disease and other diseases of the circulatory system: Secondary | ICD-10-CM

## 2020-11-11 DIAGNOSIS — Z7189 Other specified counseling: Secondary | ICD-10-CM

## 2020-11-11 DIAGNOSIS — R002 Palpitations: Secondary | ICD-10-CM

## 2020-11-11 DIAGNOSIS — Z79899 Other long term (current) drug therapy: Secondary | ICD-10-CM

## 2020-11-11 DIAGNOSIS — R030 Elevated blood-pressure reading, without diagnosis of hypertension: Secondary | ICD-10-CM

## 2020-11-11 NOTE — Patient Instructions (Signed)
Medication Instructions:  Your Physician recommend you continue on your current medication as directed.    *If you need a refill on your cardiac medications before your next appointment, please call your pharmacy*   Lab Work: Your provider has recommended lab work. Please have this collected at [facility] at [place]. The lab is open 8:00 am - 4:30 pm. Please avoid 12:00p - 1:00p for lunch hour. You do not need an appointment. Please go to [address] [place], [place] [address]. This is in the Primary Care office on the 3rd floor, let them know you are there for blood work and they will direct you to the lab.  -fasting lipid   If you have labs (blood work) drawn today and your tests are completely normal, you will receive your results only by: MyChart Message (if you have MyChart) OR A paper copy in the mail If you have any lab test that is abnormal or we need to change your treatment, we will call you to review the results.   Testing/Procedures: None ordered today   Follow-Up: At [facility], you and your health needs are our priority.  As part of our continuing mission to provide you with exceptional heart care, we have created designated Provider Care Teams.  These Care Teams include your primary Cardiologist (physician) and Advanced Practice Providers (APPs -  Physician Assistants and Nurse Practitioners) who all work together to provide you with the care you need, when you need it.  We recommend signing up for the patient portal called "MyChart".  Sign up information is provided on this After Visit Summary.  MyChart is used to connect with patients for Virtual Visits (Telemedicine).  Patients are able to view lab/test results, encounter notes, upcoming appointments, etc.  Non-urgent messages can be sent to your provider as well.   To learn more about what you can do with MyChart, go to [email or web address].    Your next appointment:   1  year(s)  The format for your next appointment:   In Person  Provider:   Jodelle Red, MD

## 2020-11-14 ENCOUNTER — Encounter (HOSPITAL_BASED_OUTPATIENT_CLINIC_OR_DEPARTMENT_OTHER): Payer: Self-pay

## 2020-11-16 ENCOUNTER — Encounter (HOSPITAL_BASED_OUTPATIENT_CLINIC_OR_DEPARTMENT_OTHER): Payer: Self-pay

## 2020-11-17 ENCOUNTER — Telehealth: Payer: Self-pay | Admitting: Cardiology

## 2020-11-17 NOTE — Telephone Encounter (Signed)
Pt c/o BP issue: STAT if pt c/o blurred vision, one-sided weakness or slurred speech  1. What are your last 5 BP readings? 144/88 and pulse upper eighties- have not taken it today  2. Are you having any other symptoms (ex. Dizziness, headache, blurred vision, passed out)? Shaky, rapid heart rate, not att this time  3. What is your BP issue? High blood pressure for him- wanted to be seen- I made an appointment for Wednesday(11-19-20)- please call to advise

## 2020-11-17 NOTE — Telephone Encounter (Signed)
See duplicate message.  ?

## 2020-11-17 NOTE — Telephone Encounter (Signed)
Called patient to verify appointment with Hubbard Hartshorn, NP on Wednesday 9/28 at our [facility] location. Patient verbalized agreement and thanked me for the clarification as he had not read the MyChart message correctly and assumed he had an appointment tomorrow. He expressed gratitude for the call.

## 2020-11-19 ENCOUNTER — Other Ambulatory Visit: Payer: Self-pay

## 2020-11-19 ENCOUNTER — Ambulatory Visit (INDEPENDENT_AMBULATORY_CARE_PROVIDER_SITE_OTHER): Payer: Self-pay | Admitting: Family

## 2020-11-19 ENCOUNTER — Encounter (HOSPITAL_BASED_OUTPATIENT_CLINIC_OR_DEPARTMENT_OTHER): Payer: Self-pay | Admitting: Family

## 2020-11-19 VITALS — BP 136/80 | HR 73 | Ht 69.0 in | Wt 171.0 lb

## 2020-11-19 DIAGNOSIS — Z8249 Family history of ischemic heart disease and other diseases of the circulatory system: Secondary | ICD-10-CM

## 2020-11-19 DIAGNOSIS — R030 Elevated blood-pressure reading, without diagnosis of hypertension: Secondary | ICD-10-CM

## 2020-11-19 DIAGNOSIS — R002 Palpitations: Secondary | ICD-10-CM

## 2020-11-19 DIAGNOSIS — Z79899 Other long term (current) drug therapy: Secondary | ICD-10-CM

## 2020-11-19 MED ORDER — PROPRANOLOL HCL 20 MG PO TABS: 20.0000 mg | ORAL_TABLET | Freq: Three times a day (TID) | ORAL | 2 refills | Status: DC | PRN

## 2020-11-19 NOTE — Progress Notes (Signed)
Office Visit    Patient Name: Dierks Wach Date of Encounter: 11/19/2020  PCP:  Aviva Kluver   [facility]  Cardiologist:  Jodelle Red, MD  Advanced Practice Provider:  No care team member to display Electrophysiologist:  None      Chief Complaint    Kohei Antonellis is a 55 y.o. male with a hx of palpitations, asthma, whitecoat hypertension presents today for dizziness  Past Medical History    Past Medical History:  Diagnosis Date   History of asthma    as a child   No past surgical history on file.  Allergies  No Known Allergies  History of Present Illness    Calab Sachse is a 55 y.o. male with a hx of palpitations, asthma, whitecoat hypertension last seen 11/11/2020.  He was initially evaluated July 2022 for palpitations by Dr. Cristal Deer.  When evaluated at that time his palpitations had stopped when he stopped THC usage and no further evaluation was recommended. He was most recently seen 11/11/2020 reporting home blood pressure readings 100s/60-70s.  He was running daily for exercise and noted that when his heart began to rise above 120 bpm he would slow down.  He was up to 2 miles.  He noted sensation of apnea only if laying on his back due to deviated septum.  He was recommended to continue present dose of metoprolol.  He presents today for follow-up. Reports feeling well after recent visit until Sunday. Tells me Sunday his blood pressure 140/85 and heart rate 85. Tells me his resting heart rate is usually in the 60s. Tells me when this happens he feels nervous. Tells me it got so bad on Sunday that he had sensation of adrenaline rush and feeling panicky and having muscle spasms. Tells me it felt like when he previously was getting ready for a boxing match. He took an additional tablet of the Toprol with improvement in symptoms. During episode reports some chest and neck discomfort. Does note he had a family friend recently pass away suddenly  at 20 years old and wonders if stress might be contributory.   Drinks 6-7 diet sodas which he recently cut out as well as cutting out Splenda from his smoothies. Wonders if his beet juice is bothering his allergies as beets are grown int he ground and he has environmental allergies. Works in crawl spaces doing manual labor and also runs regularly for exercise.   EKGs/Labs/Other Studies Reviewed:   The following studies were reviewed today:   EKG:  EKG is ordered today.  The ekg ordered today demonstrates NSR 73 bpm with no acute ST/T wave changes.   Recent Labs: 07/26/2020: BUN 15; Creatinine, Ser 0.79; Hemoglobin 14.4; Platelets 223; Potassium 3.8; Sodium 137  Recent Lipid Panel    Component Value Date/Time   CHOL 205 (H) 09/17/2020 0820   TRIG 140 09/17/2020 0820   HDL 42 09/17/2020 0820   CHOLHDL 4.9 09/17/2020 0820   LDLCALC 138 (H) 09/17/2020 0820   Home Medications   Current Meds  Medication Sig   metoprolol succinate (TOPROL-XL) 25 MG 24 hr tablet Take 1 tablet (25 mg total) by mouth daily. Take with or immediately following a meal.     Review of Systems     All other systems reviewed and are otherwise negative except as noted above.  Physical Exam    VS:  BP (!) 160/80 (BP Location: Left Arm, Patient Position: Sitting, Cuff Size: Normal)   Pulse 73  Ht 5\' 9"  (1.753 m)   Wt 171 lb (77.6 kg)   SpO2 98%   BMI 25.25 kg/m  , BMI Body mass index is 25.25 kg/m.  Wt Readings from Last 3 Encounters:  11/19/20 171 lb (77.6 kg)  11/11/20 173 lb 6.4 oz (78.7 kg)  09/09/20 173 lb 6.4 oz (78.7 kg)     GEN: Well nourished, well developed, in no acute distress. HEENT: normal. Neck: Supple, no JVD, carotid bruits, or masses. Cardiac: RRR, no murmurs, rubs, or gallops. No clubbing, cyanosis, edema.  Radials/PT 2+ and equal bilaterally.  Respiratory:  Respirations regular and unlabored, clear to auscultation bilaterally. GI: Soft, nontender, nondistended. MS: No deformity  or atrophy. Skin: Warm and dry, no rash. Neuro:  Strength and sensation are intact. Psych: Normal affect.  Assessment & Plan    Palpitations - EKG today shows NSR with no acute ST/T wave changes and no arrhythmias. Reports sensation of palpitations with HR  >80 bpm as resting HR in the 60s. Had elevated heart rate over the weekend which resolved with additional Toprol. Likely etiology anxiety. Will continue Toprol 25mg  QD and start Propranolol 20mg  TID PRN for palpitations. He has cut out soda and was congratulated.  HTN - BP elevated in clinic today though controlled at home. Improved in clinic from 160/80 to 136/80. Known white coat hypertension. Anticipate recently elevated readings were in setting of anxiety. He is agreeable to continue home monitoring and will report BP persistently >130/80.   Family history of heart disease - Lp(a) unremarkable. Continue primary prevention through diet and exercise. Given recent episode of neck tightness and and 10 year ASCVD risk 11.%. Plan for coronary calcium scoring. If elevated calcium score will need to readdress lipid management, he has repeat lipid panel upcoming.   Disposition: Follow up in 2 month(s) with Dr. 09/11/20 or APP.  Signed, , NP 11/19/2020, 8:20 AM [facility]

## 2020-11-19 NOTE — Patient Instructions (Signed)
Medication Instructions:  Your physician has recommended you make the following change in your medication:   START Propranolol one 20mg  tablet as needed for breakthrough palpitations.   *If you need a refill on your cardiac medications before your next appointment, please call your pharmacy*  Lab Work: None ordered today.   Testing/Procedures: Your EKG today shows normal sinus rhythm which is a good result.   Follow-Up: At [facility], you and your health needs are our priority.  As part of our continuing mission to provide you with exceptional heart care, we have created designated Provider Care Teams.  These Care Teams include your primary Cardiologist (physician) and Advanced Practice Providers (APPs -  Physician Assistants and Nurse Practitioners) who all work together to provide you with the care you need, when you need it.  We recommend signing up for the patient portal called "MyChart".  Sign up information is provided on this After Visit Summary.  MyChart is used to connect with patients for Virtual Visits (Telemedicine).  Patients are able to view lab/test results, encounter notes, upcoming appointments, etc.  Non-urgent messages can be sent to your provider as well.   To learn more about what you can do with MyChart, go to [facility].    Your next appointment:   2 month(s)  The format for your next appointment:   In Person  Provider:   [email or web address], MD or Jodelle Red, NP    Other Instructions  To prevent palpitations: Make sure you are adequately hydrated.  Avoid and/or limit caffeine containing beverages like soda or tea. Exercise regularly.  Manage stress well. Some over the counter medications can cause palpitations such as Benadryl, AdvilPM, TylenolPM. Regular Advil or Tylenol do not cause palpitations.    Heart Healthy Diet Recommendations: A low-salt diet is recommended. Meats should be grilled, baked, or boiled. Avoid fried foods.  Focus on lean protein sources like fish or chicken with vegetables and fruits. The American Heart Association is a Alver Sorrow!    Exercise recommendations: The American Heart Association recommends 150 minutes of moderate intensity exercise weekly. Try 30 minutes of moderate intensity exercise 4-5 times per week. This could include walking, jogging, or swimming.

## 2020-12-02 ENCOUNTER — Other Ambulatory Visit: Payer: Self-pay

## 2020-12-02 ENCOUNTER — Ambulatory Visit (INDEPENDENT_AMBULATORY_CARE_PROVIDER_SITE_OTHER)
Admission: RE | Admit: 2020-12-02 | Discharge: 2020-12-02 | Disposition: A | Discharge status: Home / Self Care | Payer: Self-pay | Source: Ambulatory Visit | Attending: Family | Admitting: Family

## 2020-12-02 ENCOUNTER — Encounter (HOSPITAL_BASED_OUTPATIENT_CLINIC_OR_DEPARTMENT_OTHER): Payer: Self-pay

## 2020-12-02 DIAGNOSIS — Z8249 Family history of ischemic heart disease and other diseases of the circulatory system: Secondary | ICD-10-CM

## 2020-12-02 DIAGNOSIS — R002 Palpitations: Secondary | ICD-10-CM

## 2020-12-03 ENCOUNTER — Other Ambulatory Visit: Payer: Self-pay

## 2020-12-03 MED ORDER — ROSUVASTATIN CALCIUM 20 MG PO TABS: 20.0000 mg | ORAL_TABLET | Freq: Every day | ORAL | 4 refills | Status: DC

## 2020-12-03 MED ORDER — ASPIRIN EC 81 MG PO TBEC: 81.0000 mg | DELAYED_RELEASE_TABLET | Freq: Every day | ORAL | 3 refills | Status: AC

## 2020-12-21 ENCOUNTER — Other Ambulatory Visit (HOSPITAL_BASED_OUTPATIENT_CLINIC_OR_DEPARTMENT_OTHER): Payer: Self-pay | Admitting: Family

## 2020-12-21 DIAGNOSIS — Z79899 Other long term (current) drug therapy: Secondary | ICD-10-CM

## 2020-12-21 DIAGNOSIS — R002 Palpitations: Secondary | ICD-10-CM

## 2021-01-08 LAB — LIPID PANEL
Chol/HDL Ratio: 3 ratio (ref 0.0–5.0)
Cholesterol, Total: 75 mg/dL — ABNORMAL LOW (ref 100–199)
HDL: 25 mg/dL — ABNORMAL LOW (ref >39)
LDL Chol Calc (NIH): 34 mg/dL (ref 0–99)
Triglycerides: 74 mg/dL (ref 0–149)
VLDL Cholesterol Cal: 16 mg/dL (ref 5–40)

## 2021-01-12 ENCOUNTER — Encounter (HOSPITAL_BASED_OUTPATIENT_CLINIC_OR_DEPARTMENT_OTHER): Payer: Self-pay

## 2021-01-12 NOTE — Telephone Encounter (Signed)
FYI

## 2021-01-14 ENCOUNTER — Ambulatory Visit (HOSPITAL_BASED_OUTPATIENT_CLINIC_OR_DEPARTMENT_OTHER): Payer: Self-pay | Admitting: Cardiology

## 2021-04-27 ENCOUNTER — Other Ambulatory Visit (HOSPITAL_BASED_OUTPATIENT_CLINIC_OR_DEPARTMENT_OTHER): Payer: Self-pay | Admitting: Family

## 2021-04-27 DIAGNOSIS — R002 Palpitations: Secondary | ICD-10-CM

## 2021-04-27 DIAGNOSIS — Z79899 Other long term (current) drug therapy: Secondary | ICD-10-CM

## 2021-04-27 NOTE — Telephone Encounter (Signed)
Rx(s) sent to pharmacy electronically.  

## 2021-04-30 ENCOUNTER — Other Ambulatory Visit: Payer: Self-pay

## 2021-04-30 ENCOUNTER — Encounter (HOSPITAL_BASED_OUTPATIENT_CLINIC_OR_DEPARTMENT_OTHER): Payer: Self-pay | Admitting: Cardiology

## 2021-04-30 ENCOUNTER — Ambulatory Visit (INDEPENDENT_AMBULATORY_CARE_PROVIDER_SITE_OTHER): Payer: Self-pay | Admitting: Cardiology

## 2021-04-30 VITALS — BP 120/84 | HR 72 | Ht 69.0 in | Wt 169.8 lb

## 2021-04-30 DIAGNOSIS — Z79899 Other long term (current) drug therapy: Secondary | ICD-10-CM

## 2021-04-30 DIAGNOSIS — Z7189 Other specified counseling: Secondary | ICD-10-CM

## 2021-04-30 DIAGNOSIS — Z8249 Family history of ischemic heart disease and other diseases of the circulatory system: Secondary | ICD-10-CM

## 2021-04-30 DIAGNOSIS — R002 Palpitations: Secondary | ICD-10-CM

## 2021-04-30 DIAGNOSIS — I2510 Atherosclerotic heart disease of native coronary artery without angina pectoris: Secondary | ICD-10-CM

## 2021-04-30 DIAGNOSIS — R030 Elevated blood-pressure reading, without diagnosis of hypertension: Secondary | ICD-10-CM

## 2021-04-30 MED ORDER — PROPRANOLOL HCL 20 MG PO TABS: 20.0000 mg | ORAL_TABLET | Freq: Three times a day (TID) | ORAL | 11 refills | Status: DC | PRN

## 2021-04-30 MED ORDER — ROSUVASTATIN CALCIUM 20 MG PO TABS: 20.0000 mg | ORAL_TABLET | Freq: Every day | ORAL | 3 refills | Status: DC

## 2021-04-30 NOTE — Patient Instructions (Signed)
Medication Instructions:  ?Your Physician recommend you continue on your current medication as directed.   ? ?*If you need a refill on your cardiac medications before your next appointment, please call your pharmacy* ? ? ?Lab Work: ?Your provider has recommended lab work in 1 year (Lipid). Please have this collected at [facility] at [place]. The lab is open 8:00 am - 4:30 pm. Please avoid 12:00p - 1:00p for lunch hour. You do not need an appointment. Please go to [address] [place], [place] [address]. This is in the Primary Care office on the 3rd floor, let them know you are there for blood work and they will direct you to the lab. ? ?If you have labs (blood work) drawn today and your tests are completely normal, you will receive your results only by: ?MyChart Message (if you have MyChart) OR ?A paper copy in the mail ?If you have any lab test that is abnormal or we need to change your treatment, we will call you to review the results. ? ? ?Testing/Procedures: ?None ordered today ? ? ?Follow-Up: ?At [facility], you and your health needs are our priority.  As part of our continuing mission to provide you with exceptional heart care, we have created designated Provider Care Teams.  These Care Teams include your primary Cardiologist (physician) and Advanced Practice Providers (APPs -  Physician Assistants and Nurse Practitioners) who all work together to provide you with the care you need, when you need it. ? ?We recommend signing up for the patient portal called "MyChart".  Sign up information is provided on this After Visit Summary.  MyChart is used to connect with patients for Virtual Visits (Telemedicine).  Patients are able to view lab/test results, encounter notes, upcoming appointments, etc.  Non-urgent messages can be sent to your provider as well.   ?To learn more about what you can do with MyChart, go to [email or web address].   ? ?Your next appointment:   ?1  year(s) ? ?The format for your next appointment:   ?In Person ? ?Provider:   ?Buford Dresser, MD{ ? ? ? ?

## 2021-04-30 NOTE — Progress Notes (Signed)
Cardiology Office Note:    Date:  04/30/2021   ID:  Alan Fuentes, DOB 1965/03/20, MRN [id number]  PCP:  Pcp, No  Cardiologist:  Buford Dresser, MD  CC: follow up  History of Present Illness:    Alan Fuentes is a 56 y.o. male with a hx of asthma who is seen for follow-up. I initially met him 09/09/2020 for the evaluation and management of palpitations.  Today: Overall, he appears well. He presents a blood pressure log, with readings a couple times a week. Generally well controlled at home. He has known white coat syndrome in clinic.  Every once in a while he experiences chest tightness, but this is mild and he wonders if this is due to poor posture at times.  Lately his exercise consists of using an indoor bike for 30 minutes a day. He denies issues with his heart rate or palpitations. He has been cutting his PRN propranolol into fragments and taking them a little at a time. This seems to help control his symptoms well.  Since his last appointment he has started a plant based diet, and eliminated dairy products for about a month now. Since then he notices significant improvement in his sleep apnea. He continues with daily beet juice. Most recent LDL 34.  He denies any shortness of breath, or peripheral edema. No lightheadedness, headaches, syncope, orthopnea, or PND.   Past Medical History:  Diagnosis Date   History of asthma    as a child    History reviewed. No pertinent surgical history.  Current Medications: Current Outpatient Medications on File Prior to Visit  Medication Sig   aspirin EC 81 MG tablet Take 1 tablet (81 mg total) by mouth daily. Swallow whole.   metoprolol succinate (TOPROL-XL) 25 MG 24 hr tablet Take 1 tablet (25 mg total) by mouth daily. Take with or immediately following a meal.   No current facility-administered medications on file prior to visit.     Allergies:   Patient has no known allergies.   Social History   Tobacco Use   Smoking  status: Never   Smokeless tobacco: Never  Substance Use Topics   Alcohol use: Never   Drug use: Not Currently    Types: Marijuana    Family History: family history includes Congestive Heart Failure in his father; Heart attack in his father; Lung cancer in his maternal grandmother; Rheumatic fever in his father; Valvular heart disease in his mother. Father had heart attack at 48 yo, had CABG x3, rheumatic fever as child. Mother had valve replacement a few years ago. Maternal grandmother died of lung cancer, no cardiac issues. Maternal great-grandmother had a stroke at 54 yo. Has siblings with no health issues.  ROS:   Please see the history of present illness. (+) Mild chest tightness All other systems are reviewed and negative.    EKGs/Labs/Other Studies Reviewed:    The following studies were reviewed today:  CT Calcium Score 12/02/2020: FINDINGS: Non-cardiac: No significant non cardiac findings on limited lung and soft tissue windows. See separate report from [facility].   Ascending Aorta: Normal diameter 3.4 cm   Pericardium: Normal   Coronary arteries: Calcium noted in RCA and LAD   LM: 0   LAD 201   RCA: 24   LCX: 0   Total: 225   IMPRESSION: Coronary calcium score of 225. This was 17 th percentile for age and sex matched control.  EKG:  EKG is personally reviewed.   04/30/2021: EKG was  not ordered. 11/11/2020: not ordered 09/09/2020: normal sinus rhythm at 82 bpm  Recent Labs: 07/26/2020: BUN 15; Creatinine, Ser 0.79; Hemoglobin 14.4; Platelets 223; Potassium 3.8; Sodium 137   Recent Lipid Panel    Component Value Date/Time   CHOL 75 (L) 01/08/2021 0834   TRIG 74 01/08/2021 0834   HDL 25 (L) 01/08/2021 0834   CHOLHDL 3.0 01/08/2021 0834   LDLCALC 34 01/08/2021 0834    Physical Exam:    VS:  BP 120/84    Pulse 72    Ht _0  (1.753 m)    Wt 169 lb 12.8 oz (77 kg)    SpO2 99%    BMI 25.08 kg/m     Wt Readings from Last 3 Encounters:   04/30/21 169 lb 12.8 oz (77 kg)  11/19/20 171 lb (77.6 kg)  11/11/20 173 lb 6.4 oz (78.7 kg)    GEN: Well nourished, well developed in no acute distress HEENT: Normal, moist mucous membranes NECK: No JVD CARDIAC: regular rhythm, normal S1 and S2, no rubs or gallops. No murmur. VASCULAR: Radial and DP pulses 2+ bilaterally. No carotid bruits RESPIRATORY:  Clear to auscultation without rales, wheezing or rhonchi  ABDOMEN: Soft, non-tender, non-distended MUSCULOSKELETAL:  Ambulates independently SKIN: Warm and dry, no edema NEUROLOGIC:  Alert and oriented x 3. No focal neuro deficits noted. PSYCHIATRIC:  Normal affect    ASSESSMENT:    1. Coronary artery disease involving native coronary artery of native heart without angina pectoris   2. Palpitations   3. Medication management   4. Cardiac risk counseling   5. Counseling on health promotion and disease prevention   6. Family history of heart disease   7. White coat syndrome without diagnosis of hypertension     PLAN:    Palpitations: -well controlled on metoprolol without limiting heart rate rise with exercise. Uses propranolol PRN -see prior discussion re: holding on further evaluation  White coat hypertension -readings at home consistent and excellent to borderline low -would use home readings to guide therapy  Coronary calcium, consistent with nonobstructive CAD family history of heart disease Hypercholesterolemia -lp(a) normal -Calcium score 225 -he has worked hard on diet and exercise. Plant based diet, increased exercise. -LDL goal <70, drastically improved on most recent labs 01/08/21: Tchol 75, TG 74, HDL 25, LDL 34. Prior to initiation of statin, Tchol 205, TG 140, HDL 42, LDL 138 -continue rosuvastatin 20 mg daily -continue aspirin 81 mg daily -continue excellent lifestyle  Cardiac risk counseling and prevention recommendations:  -recommend heart healthy/Mediterranean diet, with whole grains, fruits,  vegetable, fish, lean meats, nuts, and olive oil. Limit salt. -recommend moderate walking, 3-5 times/week for 30-50 minutes each session. Aim for at least 150 minutes.week. Goal should be pace of 3 miles/hours, or walking 1.5 miles in 30 minutes -recommend avoidance of tobacco products. Avoid excess alcohol. -ASCVD risk score: The ASCVD Risk score (Arnett DK, et al., 2019) failed to calculate for the following reasons:   The valid total cholesterol range is 130 to 320 mg/dL     Plan for follow up: 1 year or sooner as needed. Lipid order placed for prior to visit recheck  Buford Dresser, MD, PhD, [facility]    Medication Adjustments/Labs and Tests Ordered: Current medicines are reviewed at length with the patient today.  Concerns regarding medicines are outlined above.   Orders Placed This Encounter  Procedures   Lipid panel   Meds ordered this encounter  Medications  rosuvastatin (CRESTOR) 20 MG tablet    Sig: Take 1 tablet (20 mg total) by mouth daily.    Dispense:  90 tablet    Refill:  3   propranolol (INDERAL) 20 MG tablet    Sig: Take 1 tablet (20 mg total) by mouth 3 (three) times daily as needed.    Dispense:  30 tablet    Refill:  11    Please wait to fill until patient calls for it (just filled)   Patient Instructions  Medication Instructions:  Your Physician recommend you continue on your current medication as directed.    *If you need a refill on your cardiac medications before your next appointment, please call your pharmacy*   Lab Work: Your provider has recommended lab work in 1 year (Lipid). Please have this collected at [facility] at [place]. The lab is open 8:00 am - 4:30 pm. Please avoid 12:00p - 1:00p for lunch hour. You do not need an appointment. Please go to [address] [place], [place] [address]. This is in the Primary Care office on the 3rd floor, let them know you are there for blood work and  they will direct you to the lab.  If you have labs (blood work) drawn today and your tests are completely normal, you will receive your results only by: [place] (if you have MyChart) OR A paper copy in the mail If you have any lab test that is abnormal or we need to change your treatment, we will call you to review the results.   Testing/Procedures: None ordered today   Follow-Up: At [facility], you and your health needs are our priority.  As part of our continuing mission to provide you with exceptional heart care, we have created designated Provider Care Teams.  These Care Teams include your primary Cardiologist (physician) and Advanced Practice Providers (APPs -  Physician Assistants and Nurse Practitioners) who all work together to provide you with the care you need, when you need it.  We recommend signing up for the patient portal called "MyChart".  Sign up information is provided on this After Visit Summary.  MyChart is used to connect with patients for Virtual Visits (Telemedicine).  Patients are able to view lab/test results, encounter notes, upcoming appointments, etc.  Non-urgent messages can be sent to your provider as well.   To learn more about what you can do with MyChart, go to [email or web address].    Your next appointment:   1 year(s)  The format for your next appointment:   In Person  Provider:   Buford Dresser, MD{      I,Mathew Stumpf,acting as a scribe for Buford Dresser, MD.,have documented all relevant documentation on the behalf of Buford Dresser, MD,as directed by  Buford Dresser, MD while in the presence of Buford Dresser, MD.  I, Buford Dresser, MD, have reviewed all documentation for this visit. The documentation on 04/30/21 for the exam, diagnosis, procedures, and orders are all accurate and complete.   Signed, Buford Dresser, MD PhD 04/30/2021 10:28 AM    [place]

## 2021-05-05 ENCOUNTER — Other Ambulatory Visit: Payer: Self-pay | Admitting: Family

## 2021-05-05 DIAGNOSIS — I2510 Atherosclerotic heart disease of native coronary artery without angina pectoris: Secondary | ICD-10-CM

## 2021-05-20 ENCOUNTER — Telehealth: Payer: Self-pay | Admitting: Licensed Clinical Social Worker

## 2021-05-20 NOTE — Telephone Encounter (Signed)
LCSW attempted to reach pt this morning at [phone], no answer, message left. Pt currently uninsured/no pcp. Will reattempt as able.  ? ?Octavio Graves, MSW, LCSW ?Clinical Social Worker II ?[facility]  ?[phone]- work cell phone (preferred) ?[phone]- desk phone ? ?

## 2021-09-08 ENCOUNTER — Telehealth: Payer: Self-pay | Admitting: Cardiology

## 2021-09-08 ENCOUNTER — Other Ambulatory Visit (HOSPITAL_BASED_OUTPATIENT_CLINIC_OR_DEPARTMENT_OTHER): Payer: Self-pay

## 2021-09-08 DIAGNOSIS — R002 Palpitations: Secondary | ICD-10-CM

## 2021-09-08 MED ORDER — METOPROLOL SUCCINATE ER 25 MG PO TB24: 25.0000 mg | ORAL_TABLET | Freq: Every day | ORAL | 2 refills | Status: DC

## 2021-09-08 NOTE — Telephone Encounter (Signed)
*  STAT* If patient is at the pharmacy, call can be transferred to refill team.   1. Which medications need to be refilled? (please list name of each medication and dose if known)   metoprolol succinate (TOPROL-XL) 25 MG 24 hr tablet (Expired)    2. Which pharmacy/location (including street and city if local pharmacy) is medication to be sent to? [id number] - [place], [place] - [address] AT [facility] [address]  3. Do they need a 30 day or 90 day supply?  90 day

## 2021-09-08 NOTE — Telephone Encounter (Signed)
Rx request for metoprolol sent to pharmacy.

## 2021-09-08 NOTE — Telephone Encounter (Signed)
Rx request sent to pharmacy.  

## 2021-09-14 ENCOUNTER — Encounter (HOSPITAL_BASED_OUTPATIENT_CLINIC_OR_DEPARTMENT_OTHER): Payer: Self-pay | Admitting: Cardiology

## 2021-09-14 ENCOUNTER — Ambulatory Visit (INDEPENDENT_AMBULATORY_CARE_PROVIDER_SITE_OTHER): Payer: Self-pay | Admitting: Cardiology

## 2021-09-14 ENCOUNTER — Telehealth: Payer: Self-pay | Admitting: Cardiology

## 2021-09-14 VITALS — BP 154/90 | HR 60 | Ht 69.0 in | Wt 162.2 lb

## 2021-09-14 DIAGNOSIS — R002 Palpitations: Secondary | ICD-10-CM

## 2021-09-14 DIAGNOSIS — R600 Localized edema: Secondary | ICD-10-CM

## 2021-09-14 DIAGNOSIS — I2510 Atherosclerotic heart disease of native coronary artery without angina pectoris: Secondary | ICD-10-CM

## 2021-09-14 DIAGNOSIS — R253 Fasciculation: Secondary | ICD-10-CM

## 2021-09-14 DIAGNOSIS — R030 Elevated blood-pressure reading, without diagnosis of hypertension: Secondary | ICD-10-CM

## 2021-09-14 NOTE — Telephone Encounter (Signed)
Called pt regarding reported symptoms Pt stated its hard to explains but report it feels like a twitch in his chest then he will start shaking Pt also report he sometimes he develop pressure in neck and forehead He report symptoms happens most often when laying down   Pt denies symptoms currently. Nurse advised pt to keep scheduled appointment for today 7/24

## 2021-09-14 NOTE — Telephone Encounter (Signed)
Scheduled patient to see Dr. Cristal Deer for chest weirdness/twitch on 09/14/21 at 3:00 pm

## 2021-09-14 NOTE — Progress Notes (Signed)
Cardiology Office Note:    Date:  09/14/2021   ID:  Alan Fuentes, DOB Jul 10, 1965, MRN [id number]  PCP:  Pcp, No  Cardiologist:  Buford Dresser, MD  CC: follow up  History of Present Illness:    Alan Fuentes is a 56 y.o. male with a hx of asthma who is seen for follow-up. I initially met him 09/09/2020 for the evaluation and management of palpitations.  At his last appointment he reported well controlled blood pressures at home. Known white coat syndrome in clinic. He noted mild chest tightness at times that he thought was due to poor posture. He was cutting his PRN propranolol into fragments and taking them a little at a time, which seemed to help. He had started a plant based diet, and eliminated dairy products. There was significant improvement in his sleep apnea.   He called the office earlier today reporting a "twitch" in his chest associated with tremors, pressure in his neck and forehead. This would occur most frequently when lying down. He was scheduled for a follow up appointment today.  Today: Last Thursday and Saturday he developed sudden episodes of tremors and sudden "jerking" movements of his body. This may also occur as he is trying to fall asleep. No associated palpitations, chest pain, or shortness of breath. He denies any recent, significant lifestyle changes.   At this time he states he is feeling weird and "jittery", almost like he is having anxiety. Also he has noticed "a little bit of puffiness" in his bilateral LE which he states is a new symptom. He monitors his weight regularly; at home he was down to 163 lbs.  He continues to follow a completely vegan diet. He exercises 5-6 days a week, 30-60 minutes at a time. There are no new physical limitations or exertional symptoms.  Lately he is taking propranol every once in a while.  He denies any lightheadedness, headaches, syncope, orthopnea, or PND.   Past Medical History:  Diagnosis Date   History of  asthma    as a child    No past surgical history on file.  Current Medications: Current Outpatient Medications on File Prior to Visit  Medication Sig   aspirin EC 81 MG tablet Take 1 tablet (81 mg total) by mouth daily. Swallow whole.   metoprolol succinate (TOPROL-XL) 25 MG 24 hr tablet Take 1 tablet (25 mg total) by mouth daily. Take with or immediately following a meal.   propranolol (INDERAL) 20 MG tablet Take 1 tablet (20 mg total) by mouth 3 (three) times daily as needed.   rosuvastatin (CRESTOR) 20 MG tablet TAKE 1 TABLET(20 MG) BY MOUTH DAILY   No current facility-administered medications on file prior to visit.     Allergies:   Patient has no known allergies.   Social History   Tobacco Use   Smoking status: Never   Smokeless tobacco: Never  Substance Use Topics   Alcohol use: Never   Drug use: Not Currently    Types: Marijuana    Family History: family history includes Congestive Heart Failure in his father; Heart attack in his father; Lung cancer in his maternal grandmother; Rheumatic fever in his father; Valvular heart disease in his mother. Father had heart attack at 93 yo, had CABG x3, rheumatic fever as child. Mother had valve replacement a few years ago. Maternal grandmother died of lung cancer, no cardiac issues. Maternal great-grandmother had a stroke at 79 yo. Has siblings with no health issues.  ROS:  Please see the history of present illness. (+) Jerking sensations/movements (+) Feeling jittery/anxious (+) Tremors (+) Bilateral LE edema All other systems are reviewed and negative.    EKGs/Labs/Other Studies Reviewed:    The following studies were reviewed today:  CT Calcium Score 12/02/2020: FINDINGS: Non-cardiac: No significant non cardiac findings on limited lung and soft tissue windows. See separate report from [facility].   Ascending Aorta: Normal diameter 3.4 cm   Pericardium: Normal   Coronary arteries: Calcium noted in RCA  and LAD   LM: 0   LAD 201   RCA: 24   LCX: 0   Total: 225   IMPRESSION: Coronary calcium score of 225. This was 10 th percentile for age and sex matched control.  EKG:  EKG is personally reviewed.   09/14/2021:  NSR at 60 bpm 04/30/2021: EKG was not ordered. 11/11/2020: not ordered 09/09/2020: normal sinus rhythm at 82 bpm  Recent Labs: No results found for requested labs within last 365 days.   Recent Lipid Panel    Component Value Date/Time   CHOL 75 (L) 01/08/2021 0834   TRIG 74 01/08/2021 0834   HDL 25 (L) 01/08/2021 0834   CHOLHDL 3.0 01/08/2021 0834   LDLCALC 34 01/08/2021 0834    Physical Exam:    VS:  BP (!) 154/90 (BP Location: Left Arm, Patient Position: Sitting, Cuff Size: Normal)   Pulse 60   Ht _0  (1.753 m)   Wt 162 lb 3.2 oz (73.6 kg)   BMI 23.95 kg/m     Wt Readings from Last 3 Encounters:  09/14/21 162 lb 3.2 oz (73.6 kg)  04/30/21 169 lb 12.8 oz (77 kg)  11/19/20 171 lb (77.6 kg)    GEN: Well nourished, well developed in no acute distress HEENT: Normal, moist mucous membranes NECK: No JVD CARDIAC: regular rhythm, normal S1 and S2, no rubs or gallops. No murmur. VASCULAR: Radial and DP pulses 2+ bilaterally. No carotid bruits RESPIRATORY:  Clear to auscultation without rales, wheezing or rhonchi  ABDOMEN: Soft, non-tender, non-distended MUSCULOSKELETAL:  Ambulates independently SKIN: Warm and dry, 1+ pitting LE edema bilaterally NEUROLOGIC:  Alert and oriented x 3. No focal neuro deficits noted. PSYCHIATRIC:  Normal affect    ASSESSMENT:    1. Muscle twitch   2. Palpitations   3. Coronary artery disease involving native coronary artery of native heart without angina pectoris   4. Bilateral leg edema   5. White coat syndrome without diagnosis of hypertension    PLAN:    Muscle twitch/jitteriness -unclear etiology. Atypical symptoms for cardiac cause. Nonexertional. Reviewed prior calcium score. Normal ECG today -he will contact us  if this continues -reviewed red flag warning signs that need immediate medical attention  Palpitations: -well controlled on metoprolol without limiting heart rate rise with exercise. Uses propranolol PRN -see prior discussion re: holding on further evaluation  White coat hypertension -readings at home consistent and excellent to borderline low -would use home readings to guide therapy  Bilateral LE edema -new -with jitteriness, check thyroid -check BNP, BMET  Coronary calcium, consistent with nonobstructive CAD family history of heart disease Hypercholesterolemia -lp(a) normal -Calcium score 225 -he has worked hard on diet and exercise. Plant based diet, increased exercise. -LDL goal <70, drastically improved on most recent labs 01/08/21: Tchol 75, TG 74, HDL 25, LDL 34. Prior to initiation of statin, Tchol 205, TG 140, HDL 42, LDL 138 -continue rosuvastatin 20 mg daily -continue aspirin 81 mg daily -continue excellent lifestyle  Cardiac risk counseling and prevention recommendations:  -recommend heart healthy/Mediterranean diet, with whole grains, fruits, vegetable, fish, lean meats, nuts, and olive oil. Limit salt. -recommend moderate walking, 3-5 times/week for 30-50 minutes each session. Aim for at least 150 minutes.week. Goal should be pace of 3 miles/hours, or walking 1.5 miles in 30 minutes -recommend avoidance of tobacco products. Avoid excess alcohol. -ASCVD risk score: The ASCVD Risk score (Arnett DK, et al., 2019) failed to calculate for the following reasons:   The valid total cholesterol range is 130 to 320 mg/dL     Plan for follow up: 1 year or sooner as needed.  Buford Dresser, MD, PhD, [facility]    Medication Adjustments/Labs and Tests Ordered: Current medicines are reviewed at length with the patient today.  Concerns regarding medicines are outlined above.   Orders Placed This Encounter  Procedures   Thyroid Panel With TSH    Basic Metabolic Panel (BMET)   B Nat Peptide   EKG 12-Lead   No orders of the defined types were placed in this encounter.  Patient Instructions  Medication Instructions:  Your Physician recommend you continue on your current medication as directed.    *If you need a refill on your cardiac medications before your next appointment, please call your pharmacy*   Lab Work: Your provider has recommended lab work today (BNP, BMP, Thyroid). Please have this collected at [facility] at [place]. The lab is open 8:00 am - 4:30 pm. Please avoid 12:00p - 1:00p for lunch hour. You do not need an appointment. Please go to [address] [place], [place] [address]. This is in the Primary Care office on the 3rd floor, let them know you are there for blood work and they will direct you to the lab.  If you have labs (blood work) drawn today and your tests are completely normal, you will receive your results only by: [place] (if you have MyChart) OR A paper copy in the mail If you have any lab test that is abnormal or we need to change your treatment, we will call you to review the results.   Testing/Procedures: None ordered today   Follow-Up: At [facility], you and your health needs are our priority.  As part of our continuing mission to provide you with exceptional heart care, we have created designated Provider Care Teams.  These Care Teams include your primary Cardiologist (physician) and Advanced Practice Providers (APPs -  Physician Assistants and Nurse Practitioners) who all work together to provide you with the care you need, when you need it.  We recommend signing up for the patient portal called "MyChart".  Sign up information is provided on this After Visit Summary.  MyChart is used to connect with patients for Virtual Visits (Telemedicine).  Patients are able to view lab/test results, encounter notes, upcoming appointments, etc.  Non-urgent messages can  be sent to your provider as well.   To learn more about what you can do with MyChart, go to [email or web address].    Your next appointment:   1 year(s)  The format for your next appointment:   In Person  Provider:   Buford Dresser, MD{       I,Mathew Stumpf,acting as a scribe for Buford Dresser, MD.,have documented all relevant documentation on the behalf of Buford Dresser, MD,as directed by  Buford Dresser, MD while in the presence of Buford Dresser, MD.  I, Buford Dresser, MD, have reviewed all documentation for  this visit. The documentation on 09/14/21 for the exam, diagnosis, procedures, and orders are all accurate and complete.   Signed, Buford Dresser, MD PhD 09/14/2021     [place]

## 2021-09-14 NOTE — Patient Instructions (Signed)
Medication Instructions:  Your Physician recommend you continue on your current medication as directed.    *If you need a refill on your cardiac medications before your next appointment, please call your pharmacy*   Lab Work: Your provider has recommended lab work today (BNP, BMP, Thyroid). Please have this collected at [facility] at [place]. The lab is open 8:00 am - 4:30 pm. Please avoid 12:00p - 1:00p for lunch hour. You do not need an appointment. Please go to [address] [place], [place] [address]. This is in the Primary Care office on the 3rd floor, let them know you are there for blood work and they will direct you to the lab.  If you have labs (blood work) drawn today and your tests are completely normal, you will receive your results only by: MyChart Message (if you have MyChart) OR A paper copy in the mail If you have any lab test that is abnormal or we need to change your treatment, we will call you to review the results.   Testing/Procedures: None ordered today   Follow-Up: At [facility], you and your health needs are our priority.  As part of our continuing mission to provide you with exceptional heart care, we have created designated Provider Care Teams.  These Care Teams include your primary Cardiologist (physician) and Advanced Practice Providers (APPs -  Physician Assistants and Nurse Practitioners) who all work together to provide you with the care you need, when you need it.  We recommend signing up for the patient portal called "MyChart".  Sign up information is provided on this After Visit Summary.  MyChart is used to connect with patients for Virtual Visits (Telemedicine).  Patients are able to view lab/test results, encounter notes, upcoming appointments, etc.  Non-urgent messages can be sent to your provider as well.   To learn more about what you can do with MyChart, go to [email or web address].    Your next appointment:   1  year(s)  The format for your next appointment:   In Person  Provider:   Jodelle Red, MD{

## 2021-09-15 ENCOUNTER — Encounter (HOSPITAL_BASED_OUTPATIENT_CLINIC_OR_DEPARTMENT_OTHER): Payer: Self-pay

## 2021-09-15 LAB — BASIC METABOLIC PANEL
BUN/Creatinine Ratio: 19 (ref 9–20)
BUN: 14 mg/dL (ref 6–24)
CO2: 25 mmol/L (ref 20–29)
Calcium: 10.2 mg/dL (ref 8.7–10.2)
Chloride: 102 mmol/L (ref 96–106)
Creatinine, Ser: 0.73 mg/dL — ABNORMAL LOW (ref 0.76–1.27)
Glucose: 101 mg/dL — ABNORMAL HIGH (ref 70–99)
Potassium: 5.1 mmol/L (ref 3.5–5.2)
Sodium: 141 mmol/L (ref 134–144)
eGFR: 105 mL/min/{1.73_m2} (ref >59)

## 2021-09-15 LAB — THYROID PANEL WITH TSH
Free Thyroxine Index: 2.7 (ref 1.2–4.9)
T3 Uptake Ratio: 23 % — ABNORMAL LOW (ref 24–39)
T4, Total: 11.7 ug/dL (ref 4.5–12.0)
TSH: 3.03 u[IU]/mL (ref 0.450–4.500)

## 2021-09-15 LAB — BRAIN NATRIURETIC PEPTIDE: BNP: 138.8 pg/mL — ABNORMAL HIGH (ref 0.0–100.0)

## 2021-09-16 ENCOUNTER — Encounter (HOSPITAL_BASED_OUTPATIENT_CLINIC_OR_DEPARTMENT_OTHER): Payer: Self-pay

## 2021-09-20 ENCOUNTER — Encounter (HOSPITAL_BASED_OUTPATIENT_CLINIC_OR_DEPARTMENT_OTHER): Payer: Self-pay

## 2021-09-21 NOTE — Telephone Encounter (Signed)
Pt. Responding to you!  

## 2021-09-21 NOTE — Telephone Encounter (Signed)
How would you like me to respond?

## 2021-09-23 NOTE — Progress Notes (Unsigned)
Office Visit    Patient Name: Alan Fuentes Date of Encounter: 09/24/2021  PCP:  Aviva Kluver   [facility]  Cardiologist:  Jodelle Red, MD  Advanced Practice Provider:  No care team member to display Electrophysiologist:  None      Chief Complaint    Alan Fuentes is a 56 y.o. male with a hx of palpitations, asthma, CAD, whitecoat hypertension presents today for chest pain  Past Medical History    Past Medical History:  Diagnosis Date   History of asthma    as a child   No past surgical history on file.  Allergies  No Known Allergies  History of Present Illness    [name] is a 56 y.o. male with a hx of palpitations, CAD, asthma, whitecoat hypertension last seen 09/14/21  He was initially evaluated July 2022 for palpitations by Dr. Cristal Deer.  His palpitations had stopped when he stopped THC usage and no further evaluation was recommended.   Coronary calcium score 11/2020 coronary calcium score of 225 (LAD 201, RCA 24) placing him in the 86th percentile. Non cardiac read did show extensive degenerative changes int he thoracic spine with bridging osteophytes.   Seen 04/30/21 with well cntrolled BP at home. Noted chest tightness which he attributed to poor posture.   He was seen 09/14/21 and noted a  "twitch" in his chest and body tremors. Reassurance provided that this was on cardiac. However as he sent multiple MyChart messages regarding his concerns about "blockages" and was scheduled for follow up.   Presents today for follow up. He is noticing chest pains. He has been remaining sedentary over the last week and a half due to chest discomfort. He describes it as a sharp midsternal pain which occurs at rest and sometimes with activity. Tells me yesterday had discomfort on and off from afternoon until evening. Notes it has been getting more frequent and lasting longer. No noted aggravating or relieving factors. BP at home 140s/100s. Heart  rate has been in the 50s. He takes his Metoprolol around 6 AM. He continues to take "pieces" of Propranolol at a time during the day for palpitations. No shortness of breath at rest. Does note sweating a lot easier with activity. He does verbalize that some anxiety may be contributory.   EKGs/Labs/Other Studies Reviewed:   The following studies were reviewed today:   EKG:  EKG is ordered today.  The ekg ordered today demonstrates sinus bradycardia 54 bpm with no acute ST/wave changes.  Recent Labs: 09/14/2021: BNP 138.8; BUN 14; Creatinine, Ser 0.73; Potassium 5.1; Sodium 141; TSH 3.030  Recent Lipid Panel    Component Value Date/Time   CHOL 75 (L) 01/08/2021 0834   TRIG 74 01/08/2021 0834   HDL 25 (L) 01/08/2021 0834   CHOLHDL 3.0 01/08/2021 0834   LDLCALC 34 01/08/2021 0834   Home Medications   Current Meds  Medication Sig   aspirin EC 81 MG tablet Take 1 tablet (81 mg total) by mouth daily. Swallow whole.   metoprolol succinate (TOPROL-XL) 25 MG 24 hr tablet Take 1 tablet (25 mg total) by mouth daily. Take with or immediately following a meal.   propranolol (INDERAL) 20 MG tablet Take 1 tablet (20 mg total) by mouth 3 (three) times daily as needed.   rosuvastatin (CRESTOR) 20 MG tablet TAKE 1 TABLET(20 MG) BY MOUTH DAILY     Review of Systems     All other systems reviewed and are otherwise negative  except as noted above.  Physical Exam    VS:  BP (!) 146/102   Pulse (!) 54   Ht 5\' 9"  (1.753 m)   Wt 161 lb (73 kg)   BMI 23.78 kg/m  , BMI Body mass index is 23.78 kg/m.  Wt Readings from Last 3 Encounters:  09/24/21 161 lb (73 kg)  09/14/21 162 lb 3.2 oz (73.6 kg)  04/30/21 169 lb 12.8 oz (77 kg)   GEN: Well nourished, well developed, in no acute distress. HEENT: normal. Neck: Supple, no JVD, carotid bruits, or masses. Cardiac: RRR, no murmurs, rubs, or gallops. No clubbing, cyanosis, edema.  Radials/PT 2+ and equal bilaterally.  Respiratory:  Respirations regular  and unlabored, clear to auscultation bilaterally. GI: Soft, nontender, nondistended. MS: No deformity or atrophy. Skin: Warm and dry, no rash. Neuro:  Strength and sensation are intact. Psych: Normal affect.  Assessment & Plan    Palpitations - EKG today shows SB with no acute ST/T wave changes and no arrhythmias.  Will continue Toprol 25mg  QD and Propranolol 20mg  TID PRN for palpitations.  HTN - BP elevated in clinic today - known white coat hypertension. Anticipate recently elevated readings were in setting of anxiety regarding chest pain. He is agreeable to continue home monitoring and will report BP persistently >130/80. Defer changes in antihypertensive therapy until after ischemic evaluation completed.   CAD / Family history of heart disease / HLD, LDL goal <70 -11/2019 coronary calcium score 225 (LAD 201, RCA 24) placing him in the 86 percentile for age and sex matched control.  Continue GDMT aspirin, Toprol, rosuvastatin.  12/2020 LDL 34.  Notes 2-week history of increasing chest discomfort which is overall atypical for angina as it occurs at rest and is constant.  EKG today no acute ST/T changes.  However, due to persistent chest discomfort we will pursue ischemic evaluation.  Discussed ETT but he prefers to proceed with cardiac CTA.  We will draw a troponin today to ensure no ED evaluation needed.  Also draw sed rate, CRP today due to persistent chest discomfort.  Anxiety - Encouraged him to discuss with PCP at upcoming visit.  May benefit from anxiolytic.  Disposition: Follow up in 2 month(s) with Dr. or APP.  Signed, 12/2019, NP 09/24/2021, 8:39 AM [facility]

## 2021-09-24 ENCOUNTER — Encounter: Payer: Self-pay | Admitting: Family Medicine

## 2021-09-24 ENCOUNTER — Ambulatory Visit (INDEPENDENT_AMBULATORY_CARE_PROVIDER_SITE_OTHER): Payer: Self-pay | Admitting: Family

## 2021-09-24 ENCOUNTER — Encounter (HOSPITAL_BASED_OUTPATIENT_CLINIC_OR_DEPARTMENT_OTHER): Payer: Self-pay | Admitting: Family

## 2021-09-24 VITALS — BP 146/102 | HR 54 | Ht 69.0 in | Wt 161.0 lb

## 2021-09-24 DIAGNOSIS — I10 Essential (primary) hypertension: Secondary | ICD-10-CM

## 2021-09-24 DIAGNOSIS — R002 Palpitations: Secondary | ICD-10-CM

## 2021-09-24 DIAGNOSIS — R072 Precordial pain: Secondary | ICD-10-CM

## 2021-09-24 DIAGNOSIS — I25118 Atherosclerotic heart disease of native coronary artery with other forms of angina pectoris: Secondary | ICD-10-CM

## 2021-09-24 DIAGNOSIS — R079 Chest pain, unspecified: Secondary | ICD-10-CM

## 2021-09-24 LAB — TROPONIN T: Troponin T (Highly Sensitive): 10 ng/L (ref 0–22)

## 2021-09-24 NOTE — Patient Instructions (Addendum)
Medication Instructions:   No Changes  *If you need a refill on your cardiac medications before your next appointment, please call your pharmacy*   Lab Work: Your physician recommends that you return for lab work today: STAT Troponin, CRP, Sedimentation Rate  If you have labs (blood work) drawn today and your tests are completely normal, you will receive your results only by: MyChart Message (if you have MyChart) OR A paper copy in the mail If you have any lab test that is abnormal or we need to change your treatment, we will call you to review the results.   Testing/Procedures:   Your cardiac CT will be scheduled at one of the below locations:   [facility] [address] [place], [place] [address] [phone]  OR  [facility] [address] [place], [place] [address] [phone]  If scheduled at [facility], please arrive at the [facility] En[facility] (Entrance C2) of [facility] 30 minutes prior to test start time. You can use the FREE valet parking offered at entrance C (encouraged to control the heart rate for the test)  Proceed to the [facility] Radiology Department (first floor) to check-in and test prep.  All radiology patients and guests should use entrance C2 at [facility], accessed from [facility], even though the hospital's physical address listed is [address].    If scheduled at [facility], please arrive 15 mins early for check-in and test prep.  Please follow these instructions carefully (unless otherwise directed):  Hold all erectile dysfunction medications at least 3 days (72 hrs) prior to test.  On the Night Before the Test: Be sure to Drink plenty of water. Do not consume any caffeinated/decaffeinated beverages or chocolate 12 hours prior to your test. Do not take any antihistamines 12 hours prior to  your test.   On the Day of the Test: Drink plenty of water until 1 hour prior to the test. Do not eat any food 4 hours prior to the test. You may take your regular medications prior to the test.  Take metoprolol two hours prior to test.      After the Test: Drink plenty of water. After receiving IV contrast, you may experience a mild flushed feeling. This is normal. On occasion, you may experience a mild rash up to 24 hours after the test. This is not dangerous. If this occurs, you can take Benadryl 25 mg and increase your fluid intake. If you experience trouble breathing, this can be serious. If it is severe call 911 IMMEDIATELY. If it is mild, please call our office. If you take any of these medications: Glipizide/Metformin, Avandament, Glucavance, please do not take 48 hours after completing test unless otherwise instructed.  We will call to schedule your test 2-4 weeks out understanding that some insurance companies will need an authorization prior to the service being performed.   For non-scheduling related questions, please contact the cardiac imaging nurse navigator should you have any questions/concerns: [place]well [place], Cardiac Imaging Nurse Navigator Larey Brick, Cardiac Imaging Nurse Navigator [facility] Direct Office Dial: [phone]   For scheduling needs, including cancellations and rescheduling, please call [place], [phone].    Follow-Up: At [facility], you and your health needs are our priority.  As part of our continuing mission to provide you with exceptional heart care, we have created designated Provider Care Teams.  These Care  Teams include your primary Cardiologist (physician) and Advanced Practice Providers (APPs -  Physician Assistants and Nurse Practitioners) who all work together to provide you with the care you need, when you need it.  We recommend signing up for the patient portal called "MyChart".  Sign up  information is provided on this After Visit Summary.  MyChart is used to connect with patients for Virtual Visits (Telemedicine).  Patients are able to view lab/test results, encounter notes, upcoming appointments, etc.  Non-urgent messages can be sent to your provider as well.   To learn more about what you can do with MyChart, go to [email or web address].    Your next appointment:   2 month(s)  The format for your next appointment:   In Person  Provider:   Dr. Cristal Deer or Gillian Shields, NP   Other Instructions   Important Information About Sugar

## 2021-09-24 NOTE — Telephone Encounter (Signed)
Pt saw Caitlin walker in office today

## 2021-09-25 ENCOUNTER — Ambulatory Visit: Payer: Self-pay | Admitting: Family Medicine

## 2021-09-25 ENCOUNTER — Encounter (HOSPITAL_BASED_OUTPATIENT_CLINIC_OR_DEPARTMENT_OTHER): Payer: Self-pay

## 2021-10-12 ENCOUNTER — Telehealth (HOSPITAL_COMMUNITY): Payer: Self-pay | Admitting: Emergency Medicine

## 2021-10-12 NOTE — Telephone Encounter (Signed)
Attempted to call patient regarding upcoming cardiac CT appointment. °Left message on voicemail with name and callback number °Aaban Griep RN Navigator Cardiac Imaging °[facility] °[phone] Office °[phone] Cell ° °

## 2021-10-13 ENCOUNTER — Ambulatory Visit (HOSPITAL_COMMUNITY)
Admission: RE | Admit: 2021-10-13 | Discharge: 2021-10-13 | Disposition: A | Discharge status: Home / Self Care | Payer: Self-pay | Source: Ambulatory Visit | Attending: Family | Admitting: Family

## 2021-10-13 DIAGNOSIS — R072 Precordial pain: Secondary | ICD-10-CM

## 2021-10-13 MED ORDER — METOPROLOL TARTRATE 5 MG/5ML IV SOLN
INTRAVENOUS | Status: AC
  Administered 2021-10-13: 10 mg via INTRAVENOUS
  Filled 2021-10-13: qty 10

## 2021-10-13 MED ORDER — METOPROLOL TARTRATE 5 MG/5ML IV SOLN
5.0000 mg | INTRAVENOUS | Status: DC | PRN
  Administered 2021-10-13: 10 mg via INTRAVENOUS

## 2021-10-13 MED ORDER — DILTIAZEM HCL 25 MG/5ML IV SOLN: 5.0000 mg | INTRAVENOUS | Status: DC | PRN

## 2021-10-13 MED ORDER — METOPROLOL TARTRATE 5 MG/5ML IV SOLN
INTRAVENOUS | Status: AC
  Filled 2021-10-13: qty 10

## 2021-10-13 MED ORDER — NITROGLYCERIN 0.4 MG SL SUBL
0.8000 mg | SUBLINGUAL_TABLET | Freq: Once | SUBLINGUAL | Status: AC
  Administered 2021-10-13: 0.8 mg via SUBLINGUAL

## 2021-10-13 MED ORDER — NITROGLYCERIN 0.4 MG SL SUBL
SUBLINGUAL_TABLET | SUBLINGUAL | Status: AC
  Filled 2021-10-13: qty 2

## 2021-10-13 MED ORDER — IOHEXOL 350 MG/ML SOLN
95.0000 mL | Freq: Once | INTRAVENOUS | Status: AC | PRN
  Administered 2021-10-13: 95 mL via INTRAVENOUS

## 2021-10-18 LAB — SEDIMENTATION RATE: Sed Rate: 3 mm/hr (ref 0–30)

## 2021-10-18 LAB — C-REACTIVE PROTEIN: CRP: <1 mg/L (ref 0–10)

## 2021-10-18 LAB — TROPONIN T

## 2021-10-20 ENCOUNTER — Encounter: Payer: Self-pay | Admitting: Family Medicine

## 2021-10-20 ENCOUNTER — Telehealth (HOSPITAL_BASED_OUTPATIENT_CLINIC_OR_DEPARTMENT_OTHER): Payer: Self-pay

## 2021-10-20 ENCOUNTER — Ambulatory Visit (INDEPENDENT_AMBULATORY_CARE_PROVIDER_SITE_OTHER): Payer: Self-pay | Admitting: Family Medicine

## 2021-10-20 VITALS — BP 138/82 | HR 68 | Temp 97.8°F | Ht 69.0 in | Wt 160.0 lb

## 2021-10-20 DIAGNOSIS — F410 Panic disorder [episodic paroxysmal anxiety] without agoraphobia: Secondary | ICD-10-CM

## 2021-10-20 DIAGNOSIS — F419 Anxiety disorder, unspecified: Secondary | ICD-10-CM

## 2021-10-20 MED ORDER — SERTRALINE HCL 25 MG PO TABS: 25.0000 mg | ORAL_TABLET | Freq: Every day | ORAL | 3 refills | Status: DC

## 2021-10-20 MED ORDER — ALPRAZOLAM 0.25 MG PO TABS: 0.2500 mg | ORAL_TABLET | Freq: Two times a day (BID) | ORAL | 0 refills | Status: DC | PRN

## 2021-10-20 NOTE — Telephone Encounter (Addendum)
Results called to patient who verbalizes understanding!    ----- Message from Alver Sorrow, NP sent at 10/19/2021  7:49 AM EDT ----- Normal sed rate and CRP.

## 2021-10-20 NOTE — Progress Notes (Signed)
New Patient Office Visit  Subjective    Patient ID: Alan Fuentes, male    DOB: 08/04/1965  Age: 56 y.o. MRN: [id number]  CC:  Chief Complaint  Patient presents with   Establish Care    Having real bad panic attacks, has been going to cardiologist the past year for them but they wanted to defer to PCP.     HPI Alan Fuentes presents to establish care  States he his having 3-4 panic attacks per week. Anxiety about his health.  He lost his wife 3 years ago.  States he has never taken medication for anxiety. He is ready to try.   Is not seeing a therapist and is not interested in seeing one.   Taking Ibuprofen for right hip pain and arthritis 2-3 times per week.    Vegan diet, exercise daily.  Lost 13 lbs intentionally    GERD- no longer an issue since weight loss   Asthma- no longer an issue Some allergies but diet change has helped   ?sleep apnea   Denies fever, chills, dizziness, chest pain, palpitations, shortness of breath, abdominal pain, N/V/D, urinary symptoms, LE edema.       10/20/2021   10:45 AM  Depression screen PHQ 2/9  Decreased Interest 0  Down, Depressed, Hopeless 0  PHQ - 2 Score 0  Altered sleeping 3  Tired, decreased energy 0  Change in appetite 0  Feeling bad or failure about yourself  0  Trouble concentrating 0  Moving slowly or fidgety/restless 0  Suicidal thoughts 0  PHQ-9 Score 3          Outpatient Encounter Medications as of 10/20/2021  Medication Sig   ALPRAZolam (XANAX) 0.25 MG tablet Take 1 tablet (0.25 mg total) by mouth 2 (two) times daily as needed for anxiety.   aspirin EC 81 MG tablet Take 1 tablet (81 mg total) by mouth daily. Swallow whole.   metoprolol succinate (TOPROL-XL) 25 MG 24 hr tablet Take 1 tablet (25 mg total) by mouth daily. Take with or immediately following a meal.   rosuvastatin (CRESTOR) 20 MG tablet TAKE 1 TABLET(20 MG) BY MOUTH DAILY   sertraline (ZOLOFT) 25 MG tablet Take 1 tablet (25 mg total) by  mouth daily.   propranolol (INDERAL) 20 MG tablet Take 1 tablet (20 mg total) by mouth 3 (three) times daily as needed. (Patient not taking: Reported on 10/20/2021)   No facility-administered encounter medications on file as of 10/20/2021.    Past Medical History:  Diagnosis Date   History of asthma    as a child    History reviewed. No pertinent surgical history.  Family History  Problem Relation Age of Onset   Valvular heart disease Mother    Heart attack Father    Rheumatic fever Father    Congestive Heart Failure Father    Lung cancer Maternal Grandmother     Social History   Socioeconomic History   Marital status: Single    Spouse name: Not on file   Number of children: Not on file   Years of education: Not on file   Highest education level: Not on file  Occupational History   Not on file  Tobacco Use   Smoking status: Never   Smokeless tobacco: Never  Substance and Sexual Activity   Alcohol use: Never   Drug use: Not Currently    Types: Marijuana   Sexual activity: Not on file  Other Topics Concern   Not on file  Social History Narrative   ** Merged History Encounter **       Social Determinants of Health   Financial Resource Strain: Not on file  Food Insecurity: Not on file  Transportation Needs: Not on file  Physical Activity: Not on file  Stress: Not on file  Social Connections: Not on file  Intimate Partner Violence: Not on file    ROS Pertinent positives and negatives in the history of present illness.      Objective    BP 138/82 (BP Location: Left Arm, Patient Position: Sitting, Cuff Size: Large)   Pulse 68   Temp 97.8 F (36.6 C) (Temporal)   Ht 5\' 9"  (1.753 m)   Wt 160 lb (72.6 kg)   SpO2 100%   BMI 23.63 kg/m   Physical Exam Alert and in no distress. Cardiac exam shows a regular rate and rhythm.  Lungs are clear to auscultation. No LE edema. Normal mood, behavior. No SI      Assessment & Plan:   Problem List Items  Addressed This Visit   None Visit Diagnoses     Anxiety    -  Primary   Relevant Medications   sertraline (ZOLOFT) 25 MG tablet   ALPRAZolam (XANAX) 0.25 MG tablet   Panic attacks       Relevant Medications   sertraline (ZOLOFT) 25 MG tablet   ALPRAZolam (XANAX) 0.25 MG tablet      Reviewed EMR including cardiology notes and results from cardiac testing. Sertraline 25 mg prescribed once daily.  Discussed how to take the medication and potential side effects.  Alprazolam prescribed to use as needed.  Discussed that this medication is for short-term use only.  He will avoid alcohol with alprazolam and avoid driving until he knows how it will make him feel. Declines counseling Follow-up in 2 weeks or sooner if needed.  Return for f/u 2 wks and CPE (nonfasting).   , NP-C

## 2021-10-20 NOTE — Patient Instructions (Signed)
It was a pleasure meeting you today. Thank you for trusting [place] with your health care.  Start on sertraline 25 mg once daily as discussed.  You may use alprazolam 1/2-1 whole tablet as needed for panic attacks.  Follow-up in 2 weeks.  You do not need to be fasting at that visit.  We will discuss preventive health care at that time.

## 2021-10-22 ENCOUNTER — Ambulatory Visit: Payer: Self-pay | Admitting: Family Medicine

## 2021-10-27 ENCOUNTER — Encounter (HOSPITAL_BASED_OUTPATIENT_CLINIC_OR_DEPARTMENT_OTHER): Payer: Self-pay | Admitting: Cardiology

## 2021-11-03 ENCOUNTER — Ambulatory Visit: Payer: Self-pay | Admitting: Family Medicine

## 2021-11-05 ENCOUNTER — Encounter: Payer: Self-pay | Admitting: Family Medicine

## 2021-11-24 ENCOUNTER — Ambulatory Visit (HOSPITAL_BASED_OUTPATIENT_CLINIC_OR_DEPARTMENT_OTHER): Payer: Self-pay | Admitting: Cardiology

## 2021-12-21 ENCOUNTER — Ambulatory Visit (HOSPITAL_BASED_OUTPATIENT_CLINIC_OR_DEPARTMENT_OTHER): Payer: Self-pay | Admitting: Cardiology

## 2021-12-29 ENCOUNTER — Encounter: Payer: Self-pay | Admitting: Family Medicine

## 2022-01-05 ENCOUNTER — Ambulatory Visit (HOSPITAL_BASED_OUTPATIENT_CLINIC_OR_DEPARTMENT_OTHER): Payer: Self-pay | Admitting: Cardiology

## 2022-01-29 ENCOUNTER — Encounter: Payer: Self-pay | Admitting: Family Medicine

## 2022-02-17 NOTE — Progress Notes (Signed)
Cardiology Office Note:    Date:  02/24/2022   ID:  Alan Fuentes, DOB [birth year], MRN [id number]  PCP:  Girtha Rm, NP-C  Cardiologist:  Buford Dresser, MD  CC: follow up  History of Present Illness:    Alan Fuentes is a 56 y.o. male with a hx of asthma who is seen for follow-up. I initially met him 09/09/2020 for the evaluation and management of palpitations.  Known white coat syndrome in clinic.   At his last appointment he complained of sudden episodes with tremors and jerking movements of his body. Also noted new LE edema. He was doing well maintaining a vegan diet and consistent exercise. Since his symptoms were atypical for cardiac etiology, he was advised to monitor his symptoms and contact us if they continue or worsen.  He followed up with Alan Montana, NP 09/24/21. For 2 weeks he had mostly been sedentary due to sharp, mid-sternal chest pains at rest and with activity. His EKG showed sinus bradycardia with no acute ST/T changes. Due to persistent chest discomfort they discussed ETT but he preferred to pursue cardiac CTA. On 10/13/21 his cardiac CTA revealed mild nonobstructive disease, not felt to be significant enough to cause symptoms. He was recommended for secondary prevention with continued aspirin and rosuvastatin.  Today, his blood pressure is 144/74 in the office. This morning his blood pressure at home was 132/86, which he attributes to knowing he had an appointment today. It is usually lower.   He is compliant with metoprolol with no recurring rapid heart beats. Hasn't needed to take his propranolol. Also he denies any recent issues with edema.  Lately he is down to 150 lb. He continues with a vegan diet, and 30-60 minutes of moderate cardiac exercise daily. This includes using a rowing machine and stationary bike.  He is now on Zoloft prescribed by his PCP. He believes this may be causing mild fatigue but he denies any recurring anxiety attacks.  He denies  any chest pain, shortness of breath, lightheadedness, headaches, syncope, orthopnea, or PND.   Past Medical History:  Diagnosis Date   History of asthma    as a child    History reviewed. No pertinent surgical history.  Current Medications: Current Outpatient Medications on File Prior to Visit  Medication Sig   ALPRAZolam (XANAX) 0.25 MG tablet Take 1 tablet (0.25 mg total) by mouth 2 (two) times daily as needed for anxiety.   aspirin EC 81 MG tablet Take 1 tablet (81 mg total) by mouth daily. Swallow whole.   metoprolol succinate (TOPROL-XL) 25 MG 24 hr tablet Take 1 tablet (25 mg total) by mouth daily. Take with or immediately following a meal.   propranolol (INDERAL) 20 MG tablet Take 1 tablet (20 mg total) by mouth 3 (three) times daily as needed.   rosuvastatin (CRESTOR) 20 MG tablet TAKE 1 TABLET(20 MG) BY MOUTH DAILY   sertraline (ZOLOFT) 25 MG tablet Take 1 tablet (25 mg total) by mouth daily.   No current facility-administered medications on file prior to visit.     Allergies:   Molds & smuts and Pollen extract-tree extract [pollen extract]   Social History   Tobacco Use   Smoking status: Never   Smokeless tobacco: Never  Substance Use Topics   Alcohol use: Never   Drug use: Not Currently    Types: Marijuana    Family History: family history includes Congestive Heart Failure in his father; Heart attack in his father; Lung cancer in his  maternal grandmother; Rheumatic fever in his father; Valvular heart disease in his mother. Father had heart attack at 41 yo, had CABG x3, rheumatic fever as child. Mother had valve replacement a few years ago. Maternal grandmother died of lung cancer, no cardiac issues. Maternal great-grandmother had a stroke at 11 yo. Has siblings with no health issues.  ROS:   Please see the history of present illness. (+) Fatigue All other systems are reviewed and negative.    EKGs/Labs/Other Studies Reviewed:    The following studies were  reviewed today:  Cardiac CTA  10/13/2021: IMPRESSION: 1. Coronary calcium score of 180. This was 81st percentile for age-, sex, and race-matched controls. Plaque volume 174 mm3 (calcified 26 mm3; non-calcified 148 mm3).   2. Normal coronary origin with right dominance.   3. Mild mixed density plaque (25-49%) in the mid LAD and mid RCA.   RECOMMENDATIONS: 1. Mild non-obstructive CAD (25-49%). Consider non-atherosclerotic causes of chest pain. Consider preventive therapy and risk factor modification.  CT Calcium Score 12/02/2020: FINDINGS: Non-cardiac: No significant non cardiac findings on limited lung and soft tissue windows. See separate report from [facility].   Ascending Aorta: Normal diameter 3.4 cm   Pericardium: Normal   Coronary arteries: Calcium noted in RCA and LAD   LM: 0   LAD 201   RCA: 24   LCX: 0   Total: 225   IMPRESSION: Coronary calcium score of 225. This was 22 th percentile for age and sex matched control.  EKG:  EKG is personally reviewed.   02/24/2022:  EKG was not ordered. 09/14/2021:  NSR at 60 bpm 04/30/2021: EKG was not ordered. 11/11/2020: not ordered 09/09/2020: normal sinus rhythm at 82 bpm  Recent Labs: 09/14/2021: BNP 138.8; BUN 14; Creatinine, Ser 0.73; Potassium 5.1; Sodium 141; TSH 3.030   Recent Lipid Panel    Component Value Date/Time   CHOL 75 (L) 01/08/2021 0834   TRIG 74 01/08/2021 0834   HDL 25 (L) 01/08/2021 0834   CHOLHDL 3.0 01/08/2021 0834   LDLCALC 34 01/08/2021 0834    Physical Exam:    VS:  BP 132/86 Comment: home  Pulse (!) 50   Ht _0  (1.753 m)   Wt 150 lb 12.8 oz (68.4 kg)   SpO2 98%   BMI 22.27 kg/m     Wt Readings from Last 3 Encounters:  02/24/22 150 lb 12.8 oz (68.4 kg)  10/20/21 160 lb (72.6 kg)  09/24/21 161 lb (73 kg)    GEN: Well nourished, well developed in no acute distress HEENT: Normal, moist mucous membranes NECK: No JVD CARDIAC: regular rhythm, normal S1 and S2, no rubs or  gallops. No murmur. VASCULAR: Radial and DP pulses 2+ bilaterally. No carotid bruits RESPIRATORY:  Clear to auscultation without rales, wheezing or rhonchi  ABDOMEN: Soft, non-tender, non-distended MUSCULOSKELETAL:  Ambulates independently SKIN: Warm and dry, no significant LE edema NEUROLOGIC:  Alert and oriented x 3. No focal neuro deficits noted. PSYCHIATRIC:  Normal affect    ASSESSMENT:    1. Heart palpitations   2. White coat syndrome without diagnosis of hypertension   3. Essential hypertension   4. Coronary artery disease of native artery of native heart with stable angina pectoris ([place])   5. Family history of heart disease   6. Pure hypercholesterolemia     PLAN:    Palpitations: -well controlled on metoprolol without limiting heart rate rise with exercise. Uses propranolol PRN, has not required in a long time -see prior  discussion re: holding on further evaluation  White coat hypertension -readings at home consistent and excellent to borderline low -would use home readings to guide therapy  Coronary calcium, consistent with nonobstructive CAD family history of heart disease Hypercholesterolemia -lp(a) normal -Calcium score 225 -he has worked hard on diet and exercise. Plant based diet, increased exercise. -LDL goal <70, drastically improved on most recent labs 01/08/21: Tchol 75, TG 74, HDL 25, LDL 34. Prior to initiation of statin, Tchol 205, TG 140, HDL 42, LDL 138 -continue rosuvastatin 20 mg daily -continue aspirin 81 mg daily -continue excellent lifestyle  Cardiac risk counseling and prevention recommendations:  -recommend heart healthy/Mediterranean diet, with whole grains, fruits, vegetable, fish, lean meats, nuts, and olive oil. Limit salt. -recommend moderate walking, 3-5 times/week for 30-50 minutes each session. Aim for at least 150 minutes.week. Goal should be pace of 3 miles/hours, or walking 1.5 miles in 30 minutes -recommend avoidance of tobacco  products. Avoid excess alcohol.  Plan for follow up: 1 year or sooner as needed.  Buford Dresser, MD, PhD, [facility]    Medication Adjustments/Labs and Tests Ordered: Current medicines are reviewed at length with the patient today.  Concerns regarding medicines are outlined above.   No orders of the defined types were placed in this encounter.  No orders of the defined types were placed in this encounter.  Patient Instructions  Medication Instructions:  Your physician recommends that you continue on your current medications as directed. Please refer to the Current Medication list given to you today.   *If you need a refill on your cardiac medications before your next appointment, please call your pharmacy*  Lab Work: NONE  Testing/Procedures: NONE  Follow-Up: At [facility], you and your health needs are our priority.  As part of our continuing mission to provide you with exceptional heart care, we have created designated Provider Care Teams.  These Care Teams include your primary Cardiologist (physician) and Advanced Practice Providers (APPs -  Physician Assistants and Nurse Practitioners) who all work together to provide you with the care you need, when you need it.  We recommend signing up for the patient portal called "MyChart".  Sign up information is provided on this After Visit Summary.  MyChart is used to connect with patients for Virtual Visits (Telemedicine).  Patients are able to view lab/test results, encounter notes, upcoming appointments, etc.  Non-urgent messages can be sent to your provider as well.   To learn more about what you can do with MyChart, go to [email or web address].    Your next appointment:   12 month(s)  The format for your next appointment:   In Person  Provider:   Buford Dresser, MD    Important Information About Sugar        I,Mathew Stumpf,acting as a scribe for Buford Dresser,  MD.,have documented all relevant documentation on the behalf of Buford Dresser, MD,as directed by  Buford Dresser, MD while in the presence of Buford Dresser, MD.  I, Buford Dresser, MD, have reviewed all documentation for this visit. The documentation on 02/24/22 for the exam, diagnosis, procedures, and orders are all accurate and complete.   Signed, Buford Dresser, MD PhD 02/24/2022     [place]

## 2022-02-23 ENCOUNTER — Other Ambulatory Visit: Payer: Self-pay

## 2022-02-23 DIAGNOSIS — R002 Palpitations: Secondary | ICD-10-CM

## 2022-02-23 MED ORDER — METOPROLOL SUCCINATE ER 25 MG PO TB24: 25.0000 mg | ORAL_TABLET | Freq: Every day | ORAL | 2 refills | Status: DC

## 2022-02-24 ENCOUNTER — Encounter (HOSPITAL_BASED_OUTPATIENT_CLINIC_OR_DEPARTMENT_OTHER): Payer: Self-pay | Admitting: Cardiology

## 2022-02-24 ENCOUNTER — Ambulatory Visit (INDEPENDENT_AMBULATORY_CARE_PROVIDER_SITE_OTHER): Payer: Self-pay | Admitting: Cardiology

## 2022-02-24 VITALS — BP 132/86 | HR 50 | Ht 69.0 in | Wt 150.8 lb

## 2022-02-24 DIAGNOSIS — Z8249 Family history of ischemic heart disease and other diseases of the circulatory system: Secondary | ICD-10-CM

## 2022-02-24 DIAGNOSIS — E7800 Pure hypercholesterolemia, unspecified: Secondary | ICD-10-CM

## 2022-02-24 DIAGNOSIS — I10 Essential (primary) hypertension: Secondary | ICD-10-CM

## 2022-02-24 DIAGNOSIS — R002 Palpitations: Secondary | ICD-10-CM

## 2022-02-24 DIAGNOSIS — I25118 Atherosclerotic heart disease of native coronary artery with other forms of angina pectoris: Secondary | ICD-10-CM

## 2022-02-24 DIAGNOSIS — R030 Elevated blood-pressure reading, without diagnosis of hypertension: Secondary | ICD-10-CM

## 2022-02-24 NOTE — Patient Instructions (Signed)
Medication Instructions:  Your physician recommends that you continue on your current medications as directed. Please refer to the Current Medication list given to you today.   *If you need a refill on your cardiac medications before your next appointment, please call your pharmacy*  Lab Work: NONE  Testing/Procedures: NONE  Follow-Up: At [facility], you and your health needs are our priority.  As part of our continuing mission to provide you with exceptional heart care, we have created designated Provider Care Teams.  These Care Teams include your primary Cardiologist (physician) and Advanced Practice Providers (APPs -  Physician Assistants and Nurse Practitioners) who all work together to provide you with the care you need, when you need it.  We recommend signing up for the patient portal called "MyChart".  Sign up information is provided on this After Visit Summary.  MyChart is used to connect with patients for Virtual Visits (Telemedicine).  Patients are able to view lab/test results, encounter notes, upcoming appointments, etc.  Non-urgent messages can be sent to your provider as well.   To learn more about what you can do with MyChart, go to [email or web address].    Your next appointment:   12 month(s)  The format for your next appointment:   In Person  Provider:   Bridgette Christopher, MD    Important Information About Sugar       

## 2022-03-01 ENCOUNTER — Encounter: Payer: Self-pay | Admitting: Internal Medicine

## 2022-03-01 ENCOUNTER — Ambulatory Visit (INDEPENDENT_AMBULATORY_CARE_PROVIDER_SITE_OTHER): Payer: Self-pay | Admitting: Internal Medicine

## 2022-03-01 VITALS — BP 124/72 | HR 87 | Temp 98.4°F | Ht 69.0 in | Wt 145.0 lb

## 2022-03-01 DIAGNOSIS — I10 Essential (primary) hypertension: Secondary | ICD-10-CM

## 2022-03-01 DIAGNOSIS — K4090 Unilateral inguinal hernia, without obstruction or gangrene, not specified as recurrent: Secondary | ICD-10-CM | POA: Insufficient documentation

## 2022-03-01 NOTE — Assessment & Plan Note (Signed)
BP Readings from Last 3 Encounters:  03/01/22 124/72  02/24/22 132/86  10/20/21 138/82   Stable, pt to continue medical treatment toprol xl 25 qd

## 2022-03-01 NOTE — Progress Notes (Signed)
Patient ID: Alan Fuentes, male   DOB: 1965-09-17, 57 y.o.   MRN: [id number]        Chief Complaint: follow up right groin pain and swelling for 1 wk       HPI:  Alan Fuentes is a 57 y.o. male here with c/o new onset intermittent pain and swelling to the right inguinal area, Denies worsening reflux, abd pain, dysphagia, n/v, or blood, but did have some mild transient constipation.  Denies urinary symptoms such as dysuria, frequency, urgency, flank pain, hematuria or n/v, fever, chills.  Pt denies chest pain, increased sob or doe, wheezing, orthopnea, PND, increased LE swelling, palpitations, dizziness or syncope.  Seems to be worse with trying to do his Rowing machine at home.  No prior hx of same or previous surgury.        Wt Readings from Last 3 Encounters:  03/01/22 145 lb (65.8 kg)  02/24/22 150 lb 12.8 oz (68.4 kg)  10/20/21 160 lb (72.6 kg)   BP Readings from Last 3 Encounters:  03/01/22 124/72  02/24/22 132/86  10/20/21 138/82         Past Medical History:  Diagnosis Date   History of asthma    as a child   History reviewed. No pertinent surgical history.  reports that he has never smoked. He has never used smokeless tobacco. He reports that he does not currently use drugs after having used the following drugs: Marijuana. He reports that he does not drink alcohol. family history includes Congestive Heart Failure in his father; Heart attack in his father; Lung cancer in his maternal grandmother; Rheumatic fever in his father; Valvular heart disease in his mother. Allergies  Allergen Reactions   Molds & Smuts Anaphylaxis, Hives, Itching and Shortness Of Breath   Pollen Extract-Tree Extract [Pollen Extract] Swelling    Facial and throat swelling   Current Outpatient Medications on File Prior to Visit  Medication Sig Dispense Refill   ALPRAZolam (XANAX) 0.25 MG tablet Take 1 tablet (0.25 mg total) by mouth 2 (two) times daily as needed for anxiety. 20 tablet 0   aspirin EC 81  MG tablet Take 1 tablet (81 mg total) by mouth daily. Swallow whole. 90 tablet 3   metoprolol succinate (TOPROL-XL) 25 MG 24 hr tablet Take 1 tablet (25 mg total) by mouth daily. Take with or immediately following a meal. 90 tablet 2   propranolol (INDERAL) 20 MG tablet Take 1 tablet (20 mg total) by mouth 3 (three) times daily as needed. 30 tablet 11   rosuvastatin (CRESTOR) 20 MG tablet TAKE 1 TABLET(20 MG) BY MOUTH DAILY 90 tablet 3   sertraline (ZOLOFT) 25 MG tablet Take 1 tablet (25 mg total) by mouth daily. 30 tablet 3   No current facility-administered medications on file prior to visit.        ROS:  All others reviewed and negative.  Objective        PE:  BP 124/72 (BP Location: Right Arm, Patient Position: Sitting, Cuff Size: Large)   Pulse 87   Temp 98.4 F (36.9 C) (Oral)   Ht 5\' 9"  (1.753 m)   Wt 145 lb (65.8 kg)   SpO2 96%   BMI 21.41 kg/m                 Constitutional: Pt appears in NAD               HENT: Head: NCAT.  Right Ear: External ear normal.                 Left Ear: External ear normal.                Eyes: . Pupils are equal, round, and reactive to light. Conjunctivae and EOM are normal               Nose: without d/c or deformity               Neck: Neck supple. Gross normal ROM               Cardiovascular: Normal rate and regular rhythm.                 Pulmonary/Chest: Effort normal and breath sounds without rales or wheezing.                Abd:  Soft, NT, ND, + BS, no organomegaly, with right inguinal tender swelling reducible               Neurological: Pt is alert. At baseline orientation, motor grossly intact               Skin: Skin is warm. No rashes, no other new lesions, LE edema - none               Psychiatric: Pt behavior is normal without agitation   Micro: none  Cardiac tracings I have personally interpreted today:  none  Pertinent Radiological findings (summarize): none   Lab Results  Component Value Date   WBC 9.2  07/26/2020   HGB 14.4 07/26/2020   HCT 41.6 07/26/2020   PLT 223 07/26/2020   GLUCOSE 101 (H) 09/14/2021   CHOL 75 (L) 01/08/2021   TRIG 74 01/08/2021   HDL 25 (L) 01/08/2021   LDLCALC 34 01/08/2021   NA 141 09/14/2021   K 5.1 09/14/2021   CL 102 09/14/2021   CREATININE 0.73 (L) 09/14/2021   BUN 14 09/14/2021   CO2 25 09/14/2021   TSH 3.030 09/14/2021   Assessment/Plan:  Alan Fuentes is a 57 y.o. White or Caucasian [1] male with  has a past medical history of History of asthma.  Right inguinal hernia New onset, mild symptomatic, for general surgury referral for repair  Essential hypertension BP Readings from Last 3 Encounters:  03/01/22 124/72  02/24/22 132/86  10/20/21 138/82   Stable, pt to continue medical treatment toprol xl 25 qd  Followup: Return if symptoms worsen or fail to improve.  Cathlean Cower, MD 03/01/2022 9:19 PM [facility]

## 2022-03-01 NOTE — Assessment & Plan Note (Signed)
New onset, mild symptomatic, for general surgury referral for repair

## 2022-03-01 NOTE — Patient Instructions (Signed)
You will be contacted regarding the referral for: General Surgury  Please avoid heavy lifting or rowing machine for now  Please continue all other medications as before, and refills have been done if requested.  Please have the pharmacy call with any other refills you may need.  Please keep your appointments with your specialists as you may have planned

## 2022-03-03 ENCOUNTER — Encounter (HOSPITAL_COMMUNITY): Payer: Self-pay

## 2022-03-03 ENCOUNTER — Emergency Department (HOSPITAL_COMMUNITY): Payer: Self-pay

## 2022-03-03 ENCOUNTER — Other Ambulatory Visit: Payer: Self-pay

## 2022-03-03 ENCOUNTER — Emergency Department (HOSPITAL_COMMUNITY)
Admission: EM | Admit: 2022-03-03 | Discharge: 2022-03-04 | Disposition: A | Discharge status: Home / Self Care | Payer: Self-pay | Attending: Emergency Medicine | Admitting: Emergency Medicine

## 2022-03-03 DIAGNOSIS — Z7982 Long term (current) use of aspirin: Secondary | ICD-10-CM | POA: Insufficient documentation

## 2022-03-03 DIAGNOSIS — Z79899 Other long term (current) drug therapy: Secondary | ICD-10-CM | POA: Insufficient documentation

## 2022-03-03 DIAGNOSIS — R109 Unspecified abdominal pain: Secondary | ICD-10-CM

## 2022-03-03 DIAGNOSIS — I10 Essential (primary) hypertension: Secondary | ICD-10-CM | POA: Insufficient documentation

## 2022-03-03 DIAGNOSIS — J45909 Unspecified asthma, uncomplicated: Secondary | ICD-10-CM | POA: Insufficient documentation

## 2022-03-03 DIAGNOSIS — K5900 Constipation, unspecified: Secondary | ICD-10-CM | POA: Insufficient documentation

## 2022-03-03 HISTORY — DX: Unilateral inguinal hernia, without obstruction or gangrene, not specified as recurrent: K40.90

## 2022-03-03 LAB — BASIC METABOLIC PANEL
Anion gap: 11 (ref 5–15)
BUN: 9 mg/dL (ref 6–20)
CO2: 26 mmol/L (ref 22–32)
Calcium: 9.3 mg/dL (ref 8.9–10.3)
Chloride: 99 mmol/L (ref 98–111)
Creatinine, Ser: 0.66 mg/dL (ref 0.61–1.24)
GFR, Estimated: >60 mL/min (ref >60)
Glucose, Bld: 95 mg/dL (ref 70–99)
Potassium: 4.1 mmol/L (ref 3.5–5.1)
Sodium: 136 mmol/L (ref 135–145)

## 2022-03-03 LAB — CBC
HCT: 41.9 % (ref 39.0–52.0)
Hemoglobin: 14.6 g/dL (ref 13.0–17.0)
MCH: 31.1 pg (ref 26.0–34.0)
MCHC: 34.8 g/dL (ref 30.0–36.0)
MCV: 89.1 fL (ref 80.0–100.0)
Platelets: 143 10*3/uL — ABNORMAL LOW (ref 150–400)
RBC: 4.7 MIL/uL (ref 4.22–5.81)
RDW: 11.7 % (ref 11.5–15.5)
WBC: 5.8 10*3/uL (ref 4.0–10.5)
nRBC: 0 % (ref 0.0–0.2)

## 2022-03-03 MED ORDER — IOHEXOL 300 MG/ML  SOLN
100.0000 mL | Freq: Once | INTRAMUSCULAR | Status: AC | PRN
  Administered 2022-03-03: 100 mL via INTRAVENOUS

## 2022-03-03 NOTE — ED Triage Notes (Signed)
Pt states that he has an inguinal hernia and his doctor told him to come to the ed if he couldn't have a bm. Pt reports not having a bm in 3 days.

## 2022-03-03 NOTE — ED Provider Triage Note (Signed)
Emergency Medicine Provider Triage Evaluation Note  Alan Fuentes , a 57 y.o. male  was evaluated in triage.  Pt has an inguinal hernia for about 2 weeks and was told to come to the ER if he cannot have a bowel movement.  Patient reports he has not had a bowel movement in 3 days prompting him to come to the emergency department for evaluation.  No fever, nausea, vomiting.  Denies any abdominal pain, urinary symptoms.  Review of Systems  Positive: As above Negative: As above  Physical Exam  BP (!) 155/82   Pulse 84   Temp 97.9 F (36.6 C) (Oral)   Resp 16   SpO2 99%  Gen:   Awake, no distress   Resp:  Normal effort  MSK:   Moves extremities without difficulty   Medical Decision Making  Medically screening exam initiated at 8:04 PM.  Appropriate orders placed.  Ashford Clouse was informed that the remainder of the evaluation will be completed by another provider, this initial triage assessment does not replace that evaluation, and the importance of remaining in the ED until their evaluation is complete.     Rex Kras, [place] 03/04/22 0127

## 2022-03-04 MED ORDER — POLYETHYLENE GLYCOL 3350 17 GM/SCOOP PO POWD: ORAL | 0 refills | Status: DC

## 2022-03-04 NOTE — ED Provider Notes (Signed)
[facility] DEPT Provider Note  CSN: [id number] Arrival date & time: 03/03/22 4235  Chief Complaint(s) Inguinal Hernia and Constipation  HPI Alan Fuentes is a 57 y.o. male who presents to the emergency department for lower abdominal pain.  He reports that he was diagnosed with a right inguinal hernia and was instructed to present to the emergency department if he has significant pain or is not able to have a bowel movement.  He reports that for the past couple days he has had decreased bowel movements due to him not wanting to strain.  He reports that he notices bulging in the right inguinal area from time to time.  Currently denies any pain or discomfort.  No nausea or vomiting.  No other physical complaints.  The history is provided by the patient.    Past Medical History Past Medical History:  Diagnosis Date   Hernia, inguinal    History of asthma    as a child   Patient Active Problem List   Diagnosis Date Noted   Right inguinal hernia 03/01/2022   Family history of heart disease 04/30/2021   White coat syndrome without diagnosis of hypertension 11/11/2020   Heart palpitations 09/09/2020   Essential hypertension 09/09/2020   Home Medication(s) Prior to Admission medications   Medication Sig Start Date End Date Taking? Authorizing Provider  polyethylene glycol powder (MIRALAX) 17 GM/SCOOP powder Please start taking 1 capful 3 times a day for 2-3 days. Slowly cut back as needed until you have normal bowel movements. 03/04/22  Yes Vala Raffo, Grayce Sessions, MD  ALPRAZolam Duanne Moron) 0.25 MG tablet Take 1 tablet (0.25 mg total) by mouth 2 (two) times daily as needed for anxiety. 10/20/21   Henson, Vickie L, NP-C  aspirin EC 81 MG tablet Take 1 tablet (81 mg total) by mouth daily. Swallow whole. 12/03/20   Loel Dubonnet, NP  metoprolol succinate (TOPROL-XL) 25 MG 24 hr tablet Take 1 tablet (25 mg total) by mouth daily. Take with or immediately following a  meal. 02/23/22 02/18/23  Buford Dresser, MD  propranolol (INDERAL) 20 MG tablet Take 1 tablet (20 mg total) by mouth 3 (three) times daily as needed. 04/30/21   Buford Dresser, MD  rosuvastatin (CRESTOR) 20 MG tablet TAKE 1 TABLET(20 MG) BY MOUTH DAILY 05/05/21   Loel Dubonnet, NP  sertraline (ZOLOFT) 25 MG tablet Take 1 tablet (25 mg total) by mouth daily. 10/20/21   Henson, Vickie L, NP-C                                                                                                                                    Allergies Molds & smuts and Pollen extract-tree extract [pollen extract]  Review of Systems Review of Systems As noted in HPI  Physical Exam Vital Signs  I have reviewed the triage vital signs BP 121/77   Pulse 70   Temp (!) 97.3 F (  36.3 C)   Resp 16   SpO2 100%   Physical Exam Vitals reviewed.  Constitutional:      General: He is not in acute distress.    Appearance: He is well-developed. He is not diaphoretic.  HENT:     Head: Normocephalic and atraumatic.     Right Ear: External ear normal.     Left Ear: External ear normal.     Nose: Nose normal.     Mouth/Throat:     Mouth: Mucous membranes are moist.  Eyes:     General: No scleral icterus.    Conjunctiva/sclera: Conjunctivae normal.  Neck:     Trachea: Phonation normal.  Cardiovascular:     Rate and Rhythm: Normal rate and regular rhythm.  Pulmonary:     Effort: Pulmonary effort is normal. No respiratory distress.     Breath sounds: No stridor.  Abdominal:     General: There is no distension.     Tenderness: There is no abdominal tenderness.     Hernia: No hernia is present.  Musculoskeletal:        General: Normal range of motion.     Cervical back: Normal range of motion.  Neurological:     Mental Status: He is alert and oriented to person, place, and time.  Psychiatric:        Behavior: Behavior normal.     ED Results and Treatments Labs (all labs ordered are listed,  but only abnormal results are displayed) Labs Reviewed  CBC - Abnormal; Notable for the following components:      Result Value   Platelets 143 (*)    All other components within normal limits  BASIC METABOLIC PANEL                                                                                                                         EKG  EKG Interpretation  Date/Time:    Ventricular Rate:    PR Interval:    QRS Duration:   QT Interval:    QTC Calculation:   R Axis:     Text Interpretation:         Radiology CT ABDOMEN PELVIS W CONTRAST  Result Date: 03/03/2022 CLINICAL DATA:  Patient states that he has an inguinal hernia is doctor told him to come to the ED if he could not have a bowel movement. No bowel movement in 3 days. EXAM: CT ABDOMEN AND PELVIS WITH CONTRAST TECHNIQUE: Multidetector CT imaging of the abdomen and pelvis was performed using the standard protocol following bolus administration of intravenous contrast. RADIATION DOSE REDUCTION: This exam was performed according to the departmental dose-optimization program which includes automated exposure control, adjustment of the mA and/or kV according to patient size and/or use of iterative reconstruction technique. CONTRAST:  OMNIPAQUE IOHEXOL 300 MG/ML  SOLN COMPARISON:  None Available. FINDINGS: Lower chest: No acute abnormality. Hepatobiliary: No focal liver abnormality is seen. No gallstones, gallbladder wall thickening, or biliary dilatation.  Pancreas: Unremarkable. No pancreatic ductal dilatation or surrounding inflammatory changes. Spleen: Normal in size without focal abnormality. Adrenals/Urinary Tract: Adrenal glands are unremarkable. Kidneys are normal, without renal calculi, focal lesion, or hydronephrosis. Bladder is unremarkable. Stomach/Bowel: Moderate stool in the rectum. Normal caliber large and small bowel. Tentative visualization of a normal appendix. Vascular/Lymphatic: Aortic atherosclerosis. No enlarged  abdominal or pelvic lymph nodes. Reproductive: Unremarkable. Other: No free intraperitoneal fluid or air. No abdominal wall or inguinal hernia. Musculoskeletal: Advanced degenerative arthritis both hips. IMPRESSION: No acute abnormality in the abdomen or pelvis. Specifically, no abdominal wall or inguinal hernia. Moderate stool in the rectum. Electronically Signed   By: Placido Sou M.D.   On: 03/03/2022 22:45    Medications Ordered in ED Medications  iohexol (OMNIPAQUE) 300 MG/ML solution 100 mL (100 mLs Intravenous Contrast Given 03/03/22 2225)                                                                                                                                     Procedures Procedures  (including critical care time)  Medical Decision Making / ED Course   Medical Decision Making Amount and/or Complexity of Data Reviewed Labs: ordered. Decision-making details documented in ED Course. Radiology: ordered and independent interpretation performed. Decision-making details documented in ED Course.    Lower abdominal discomfort, patient has concern for strangulated hernia. Exam is benign without evidence of obvious hernia. Testicles appear normal without evidence of torsion. Patient was seen in MSE process and had labs and imaging ordered CBC without leukocytosis or anemia Metabolic panel without significant electrolyte derangements or renal sufficiency CT scan negative for any intra-abdominal inflammatory/infectious processes, bowel obstruction, hernia.  Patient does have moderate amount of stool in the rectum system with constipation.  Recommended stool softeners. Patient already has follow-up with general surgery scheduled.      Final Clinical Impression(s) / ED Diagnoses Final diagnoses:  Abdominal discomfort  Constipation, unspecified constipation type   The patient appears reasonably screened and/or stabilized for discharge and I doubt any other medical condition or  other [facility] requiring further screening, evaluation, or treatment in the ED at this time. I have discussed the findings, Dx and Tx plan with the patient/family who expressed understanding and agree(s) with the plan. Discharge instructions discussed at length. The patient/family was given strict return precautions who verbalized understanding of the instructions. No further questions at time of discharge.  Disposition: Discharge  Condition: Good  ED Discharge Orders          Ordered    polyethylene glycol powder (MIRALAX) 17 GM/SCOOP powder        03/04/22 0341              Follow Up: Girtha Rm, NP-C [place] [address] [phone]  Call  as needed  Surgery  Go to  as scheduled  This chart was dictated using voice recognition software.  Despite best efforts to proofread,  errors can occur which can change the documentation meaning.    Fatima Blank, MD 03/04/22 [phone]

## 2022-03-09 ENCOUNTER — Encounter: Payer: Self-pay | Admitting: Family Medicine

## 2022-04-13 ENCOUNTER — Encounter: Payer: Self-pay | Admitting: Family Medicine

## 2022-05-17 ENCOUNTER — Other Ambulatory Visit: Payer: Self-pay | Admitting: *Deleted

## 2022-05-17 ENCOUNTER — Other Ambulatory Visit: Payer: Self-pay

## 2022-05-17 DIAGNOSIS — I2510 Atherosclerotic heart disease of native coronary artery without angina pectoris: Secondary | ICD-10-CM

## 2022-05-17 MED ORDER — ROSUVASTATIN CALCIUM 20 MG PO TABS: ORAL_TABLET | ORAL | 3 refills | Status: DC

## 2022-06-05 ENCOUNTER — Other Ambulatory Visit (HOSPITAL_BASED_OUTPATIENT_CLINIC_OR_DEPARTMENT_OTHER): Payer: Self-pay | Admitting: Cardiology

## 2022-06-05 DIAGNOSIS — R002 Palpitations: Secondary | ICD-10-CM

## 2022-06-07 NOTE — Telephone Encounter (Signed)
Rx(s) sent to pharmacy electronically.  

## 2022-06-30 ENCOUNTER — Telehealth: Payer: Self-pay

## 2022-06-30 NOTE — Telephone Encounter (Signed)
Pt is asking for a referral to Ortho due to b/l hip and joint pain.  Pt has not fallen or injured his hip. 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

## 2022-07-08 ENCOUNTER — Encounter: Payer: Self-pay | Admitting: Family Medicine

## 2022-07-08 ENCOUNTER — Ambulatory Visit (INDEPENDENT_AMBULATORY_CARE_PROVIDER_SITE_OTHER): Payer: Self-pay | Admitting: Family Medicine

## 2022-07-08 VITALS — BP 126/76 | HR 80 | Temp 97.6°F | Ht 69.0 in | Wt 161.0 lb

## 2022-07-08 DIAGNOSIS — Z1211 Encounter for screening for malignant neoplasm of colon: Secondary | ICD-10-CM

## 2022-07-08 DIAGNOSIS — M25551 Pain in right hip: Secondary | ICD-10-CM

## 2022-07-08 DIAGNOSIS — M25552 Pain in left hip: Secondary | ICD-10-CM

## 2022-07-08 DIAGNOSIS — M160 Bilateral primary osteoarthritis of hip: Secondary | ICD-10-CM

## 2022-07-08 MED ORDER — MELOXICAM 15 MG PO TABS: 15.0000 mg | ORAL_TABLET | Freq: Every day | ORAL | 0 refills | Status: DC

## 2022-07-08 NOTE — Patient Instructions (Signed)
You can take the meloxicam with food daily until you see the orthopedist.   I ordered the Cologuard test and they will mail it to your home.   Check with [facility] for insurance options.    Hip Pain The hip is the joint between the upper legs and the lower pelvis. The bones, cartilage, tendons, and muscles of your hip joint support your body and allow you to move around. Hip pain can range from a minor ache to severe pain in one or both of your hips. The pain may be felt on the inside of the hip joint near the groin, or on the outside near the buttocks and upper thigh. You may also have swelling or stiffness in your hip area. Follow these instructions at home: Managing pain, stiffness, and swelling     If told, put ice on the painful area. Put ice in a plastic bag. Place a towel between your skin and the bag. Leave the ice on for 20 minutes, 2-3 times a day. If told, apply heat to the affected area as often as told by your health care provider. Use the heat source that your provider recommends, such as a moist heat pack or a heating pad. Place a towel between your skin and the heat source. Leave the heat on for 20-30 minutes. If your skin turns bright red, remove the ice or heat right away to prevent skin damage. The risk of damage is higher if you cannot feel pain, heat, or cold. Activity Do exercises as told by your provider. Avoid activities that cause pain. General instructions  Take over-the-counter and prescription medicines only as told by your provider. Keep a journal of your symptoms. Write down: How often you have hip pain. The location of your pain. What the pain feels like. What makes the pain worse. Sleep with a pillow between your legs on your most comfortable side. Keep all follow-up visits. Your provider will monitor your pain and activity. Contact a health care provider if: You cannot put weight on your leg. Your pain or swelling gets worse after a week. It  gets harder to walk. You have a fever. Get help right away if: You fall. You have a sudden increase in pain and swelling in your hip. Your hip is red or swollen or very tender to touch. This information is not intended to replace advice given to you by your health care provider. Make sure you discuss any questions you have with your health care provider. Document Revised: 10/13/2021 Document Reviewed: 10/13/2021 Elsevier Patient Education  2023 [employer].

## 2022-07-08 NOTE — Progress Notes (Signed)
Subjective:     Patient ID: Alan Fuentes, male    DOB: Nov 02, 1965, 57 y.o.   MRN: [id number]  Chief Complaint  Patient presents with   Referral    Would like ortho surg referral for b/l hip and joint pain due to arthritis and hernia (not torn all the way through yet so has to weit for surg)    HPI Patient is in today for chronic bilateral hip pain.  Taking ibuprofen which helps some. Recent CT abdomen showed advanced arthritis in both hips.  States he is hoping to get some injections in his hips.   He has been exercising more and having pain.  Denies pain with sleeping.  Pain is worse with sitting.  Pain improves with exercise.  Overdue for colon cancer screening. Denies family hx of colon cancer. Asymptomatic.     Health Maintenance Due  Topic Date Due   HIV Screening  Never done   Hepatitis C Screening  Never done   DTaP/Tdap/Td (1 - Tdap) Never done   Zoster Vaccines- Shingrix (1 of 2) Never done   COLONOSCOPY (Pts 45-32yrs Insurance coverage will need to be confirmed)  Never done    Past Medical History:  Diagnosis Date   Hernia, inguinal    History of asthma    as a child    History reviewed. No pertinent surgical history.  Family History  Problem Relation Age of Onset   Valvular heart disease Mother    Heart attack Father    Rheumatic fever Father    Congestive Heart Failure Father    Lung cancer Maternal Grandmother     Social History   Socioeconomic History   Marital status: Single    Spouse name: Not on file   Number of children: Not on file   Years of education: Not on file   Highest education level: 12th grade  Occupational History   Not on file  Tobacco Use   Smoking status: Never   Smokeless tobacco: Never  Substance and Sexual Activity   Alcohol use: Never   Drug use: Not Currently    Types: Marijuana   Sexual activity: Not on file  Other Topics Concern   Not on file  Social History Narrative   ** Merged History Encounter **        Social Determinants of Health   Financial Resource Strain: Medium Risk (07/07/2022)   Overall Financial Resource Strain (CARDIA)    Difficulty of Paying Living Expenses: Somewhat hard  Food Insecurity: No Food Insecurity (07/07/2022)   Hunger Vital Sign    Worried About Running Out of Food in the Last Year: Never true    Ran Out of Food in the Last Year: Never true  Transportation Needs: No Transportation Needs (07/07/2022)   PRAPARE - Administrator, Civil Service (Medical): No    Lack of Transportation (Non-Medical): No  Physical Activity: Sufficiently Active (07/07/2022)   Exercise Vital Sign    Days of Exercise per Week: 5 days    Minutes of Exercise per Session: 30 min  Stress: Stress Concern Present (07/07/2022)   [employer] of Occupational Health - Occupational Stress Questionnaire    Feeling of Stress : To some extent  Social Connections: Moderately Isolated (07/07/2022)   Social Connection and Isolation Panel [NHANES]    Frequency of Communication with Friends and Family: More than three times a week    Frequency of Social Gatherings with Friends and Family: Once a week  Attends Religious Services: 1 to 4 times per year    Active Member of Clubs or Organizations: No    Attends Banker Meetings: Not on file    Marital Status: Widowed  Intimate Partner Violence: Not on file    Outpatient Medications Prior to Visit  Medication Sig Dispense Refill   aspirin EC 81 MG tablet Take 1 tablet (81 mg total) by mouth daily. Swallow whole. 90 tablet 3   metoprolol succinate (TOPROL-XL) 25 MG 24 hr tablet Take 1 tablet (25 mg total) by mouth daily. Take with or immediately following a meal. 90 tablet 2   polyethylene glycol powder (MIRALAX) 17 GM/SCOOP powder Please start taking 1 capful 3 times a day for 2-3 days. Slowly cut back as needed until you have normal bowel movements. 255 g 0   propranolol (INDERAL) 20 MG tablet TAKE 1 TABLET(20 MG) BY MOUTH  THREE TIMES DAILY AS NEEDED 270 tablet 1   rosuvastatin (CRESTOR) 20 MG tablet TAKE 1 TABLET(20 MG) BY MOUTH DAILY 90 tablet 3   ALPRAZolam (XANAX) 0.25 MG tablet Take 1 tablet (0.25 mg total) by mouth 2 (two) times daily as needed for anxiety. (Patient not taking: Reported on 07/08/2022) 20 tablet 0   sertraline (ZOLOFT) 25 MG tablet Take 1 tablet (25 mg total) by mouth daily. (Patient not taking: Reported on 07/08/2022) 30 tablet 3   No facility-administered medications prior to visit.    Allergies  Allergen Reactions   Molds & Smuts Anaphylaxis, Hives, Itching and Shortness Of Breath   Pollen Extract-Tree Extract [Pollen Extract] Swelling    Facial and throat swelling    Review of Systems  Constitutional:  Negative for chills, fever, malaise/fatigue and weight loss.  Respiratory:  Negative for shortness of breath.   Cardiovascular:  Negative for chest pain, palpitations and leg swelling.  Gastrointestinal:  Negative for nausea and vomiting.  Genitourinary:  Negative for dysuria, frequency and urgency.  Musculoskeletal:  Positive for joint pain. Negative for falls.  Neurological:  Negative for dizziness, sensory change, weakness and headaches.  Psychiatric/Behavioral:  Negative for depression. The patient is not nervous/anxious.        Objective:    Physical Exam Constitutional:      General: He is not in acute distress.    Appearance: He is not ill-appearing.  Eyes:     Extraocular Movements: Extraocular movements intact.     Conjunctiva/sclera: Conjunctivae normal.  Cardiovascular:     Rate and Rhythm: Normal rate.  Pulmonary:     Effort: Pulmonary effort is normal.  Musculoskeletal:     Cervical back: Normal range of motion and neck supple.     Thoracic back: Normal.     Lumbar back: Normal.     Right hip: No tenderness. Decreased range of motion. Normal strength.     Left hip: No tenderness. Decreased range of motion. Normal strength.     Right lower leg: No edema.      Left lower leg: No edema.     Comments: Bilateral hip pain worse with sitting. Pain and limited ROM with flexion. Bilateral LE are neurovascularly intact.   Skin:    General: Skin is warm and dry.  Neurological:     General: No focal deficit present.     Mental Status: He is alert and oriented to person, place, and time.     Cranial Nerves: Cranial nerves 2-12 are intact.     Sensory: Sensation is intact.     Motor:  Motor function is intact.     Coordination: Coordination is intact.     Gait: Gait normal.  Psychiatric:        Attention and Perception: Attention normal.        Mood and Affect: Mood normal.        Speech: Speech normal.        Behavior: Behavior normal.        Thought Content: Thought content normal.     BP 126/76 (BP Location: Left Arm, Patient Position: Sitting, Cuff Size: Large)   Pulse 80   Temp 97.6 F (36.4 C) (Temporal)   Ht 5\' 9"  (1.753 m)   Wt 161 lb (73 kg)   SpO2 99%   BMI 23.78 kg/m  Wt Readings from Last 3 Encounters:  07/08/22 161 lb (73 kg)  03/01/22 145 lb (65.8 kg)  02/24/22 150 lb 12.8 oz (68.4 kg)       Assessment & Plan:   Problem List Items Addressed This Visit   None Visit Diagnoses     Arthritis of both hips    -  Primary   Relevant Medications   meloxicam (MOBIC) 15 MG tablet   Other Relevant Orders   Ambulatory referral to Orthopedic Surgery   Bilateral hip pain       Relevant Medications   meloxicam (MOBIC) 15 MG tablet   Other Relevant Orders   Ambulatory referral to Orthopedic Surgery   Screen for colon cancer       Relevant Orders   Cologuard      No red flag symptoms.  No sign of any infectious process. Reviewed recent ED visit including CT results showing advanced degenerative changes of both hips. Stop OTC pain medications. Try meloxicam.  He is interested in getting injections.  Referral to Ortho care. Overdue for colon cancer screening.  He is interested in Cologuard and appears to be a  candidate.  I have discontinued Alan Fuentes's sertraline and ALPRAZolam. I am also having him start on meloxicam. Additionally, I am having him maintain his aspirin EC, metoprolol succinate, polyethylene glycol powder, rosuvastatin, and propranolol.  Meds ordered this encounter  Medications   meloxicam (MOBIC) 15 MG tablet    Sig: Take 1 tablet (15 mg total) by mouth daily.    Dispense:  30 tablet    Refill:  0    Order Specific Question:   Supervising Provider    Answer:   Hillard Danker A [[id number]]

## 2022-07-13 ENCOUNTER — Ambulatory Visit (INDEPENDENT_AMBULATORY_CARE_PROVIDER_SITE_OTHER): Payer: Self-pay | Admitting: Orthopaedic Surgery

## 2022-07-13 DIAGNOSIS — M1612 Unilateral primary osteoarthritis, left hip: Secondary | ICD-10-CM

## 2022-07-13 DIAGNOSIS — M1611 Unilateral primary osteoarthritis, right hip: Secondary | ICD-10-CM

## 2022-07-13 NOTE — Progress Notes (Signed)
Office Visit Note   Patient: Alan Fuentes           Date of Birth: [birth year]           MRN: [id number] Visit Date: 07/13/2022              Requested by: Avanell Shackleton, NP-C [address] [place],  [place] [address] PCP: Avanell Shackleton, NP-C   Assessment & Plan: Visit Diagnoses:  1. Primary osteoarthritis of right hip   2. Primary osteoarthritis of left hip     Plan: Impression is advanced bilateral hip DJD.  CT scan reviewed with the patient.  Treatment options explained and he will think about doing an intra-articular injection with Dr. Shon Baton.  For now he will just take meloxicam.  He understands ultimately he is facing hip replacement.  Follow-Up Instructions: No follow-ups on file.   Orders:  No orders of the defined types were placed in this encounter.  No orders of the defined types were placed in this encounter.     Procedures: No procedures performed   Clinical Data: No additional findings.   Subjective: Chief Complaint  Patient presents with   Right Hip - Pain   Left Hip - Pain    HPI Alan Fuentes is a very pleasant 57 year old gentleman who is here for evaluation of bilateral hip DJD that is advanced and bone-on-bone.  This was incidentally found on a CT scan when he was getting worked up for inguinal hernia.  He is aware that he has advanced bilateral hip DJD.  He had difficulty with ADLs such as putting on shoes and socks.  Currently taking meloxicam which does help.  His mother had a hip replacement. Review of Systems  Constitutional: Negative.   HENT: Negative.    Eyes: Negative.   Respiratory: Negative.    Cardiovascular: Negative.   Gastrointestinal: Negative.   Endocrine: Negative.   Genitourinary: Negative.   Skin: Negative.   Allergic/Immunologic: Negative.   Neurological: Negative.   Hematological: Negative.   Psychiatric/Behavioral: Negative.    All other systems reviewed and are negative.    Objective: Vital Signs: There were  no vitals taken for this visit.  Physical Exam Vitals and nursing note reviewed.  Constitutional:      Appearance: He is well-developed.  HENT:     Head: Normocephalic and atraumatic.  Eyes:     Pupils: Pupils are equal, round, and reactive to light.  Pulmonary:     Effort: Pulmonary effort is normal.  Abdominal:     Palpations: Abdomen is soft.  Musculoskeletal:        General: Normal range of motion.     Cervical back: Neck supple.  Skin:    General: Skin is warm.  Neurological:     Mental Status: He is alert and oriented to person, place, and time.  Psychiatric:        Behavior: Behavior normal.        Thought Content: Thought content normal.        Judgment: Judgment normal.     Ortho Exam Examination of the hip shows limited range of motion with pain with movement of the hip joint. Specialty Comments:  No specialty comments available.  Imaging: No results found.   PMFS History: Patient Active Problem List   Diagnosis Date Noted   Right inguinal hernia 03/01/2022   Family history of heart disease 04/30/2021   White coat syndrome without diagnosis of hypertension 11/11/2020   Heart palpitations 09/09/2020  Essential hypertension 09/09/2020   Past Medical History:  Diagnosis Date   Hernia, inguinal    History of asthma    as a child    Family History  Problem Relation Age of Onset   Valvular heart disease Mother    Heart attack Father    Rheumatic fever Father    Congestive Heart Failure Father    Lung cancer Maternal Grandmother     No past surgical history on file. Social History   Occupational History   Not on file  Tobacco Use   Smoking status: Never   Smokeless tobacco: Never  Substance and Sexual Activity   Alcohol use: Never   Drug use: Not Currently    Types: Marijuana   Sexual activity: Not on file

## 2022-08-12 IMAGING — CR DG CHEST 2V
2 series · 2 of 2 positions shown · non-contrast
Comparison: None.

CLINICAL DATA: Chest pain x3 weeks.

EXAM:
CHEST - 2 VIEW

[w chest pa]
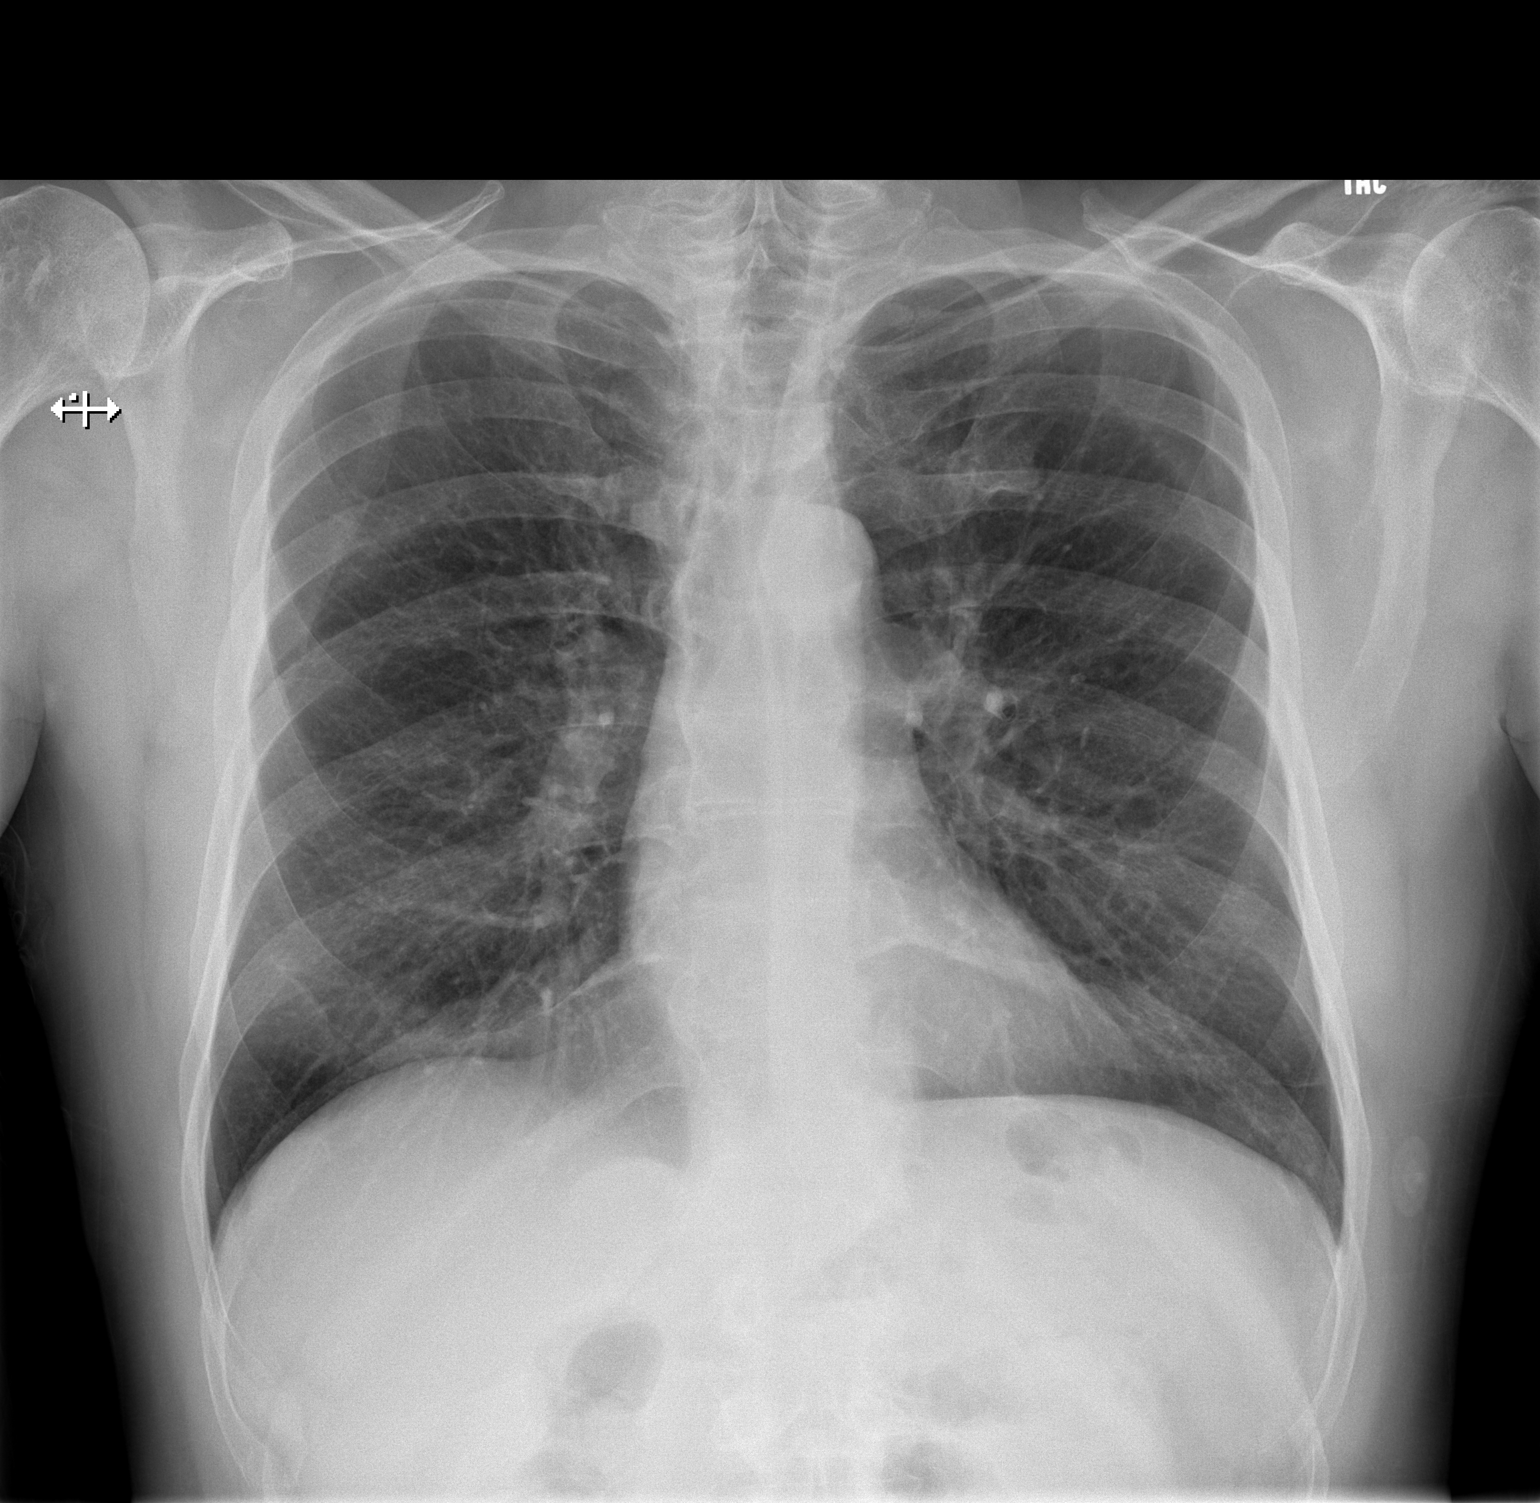

[w chest lat]
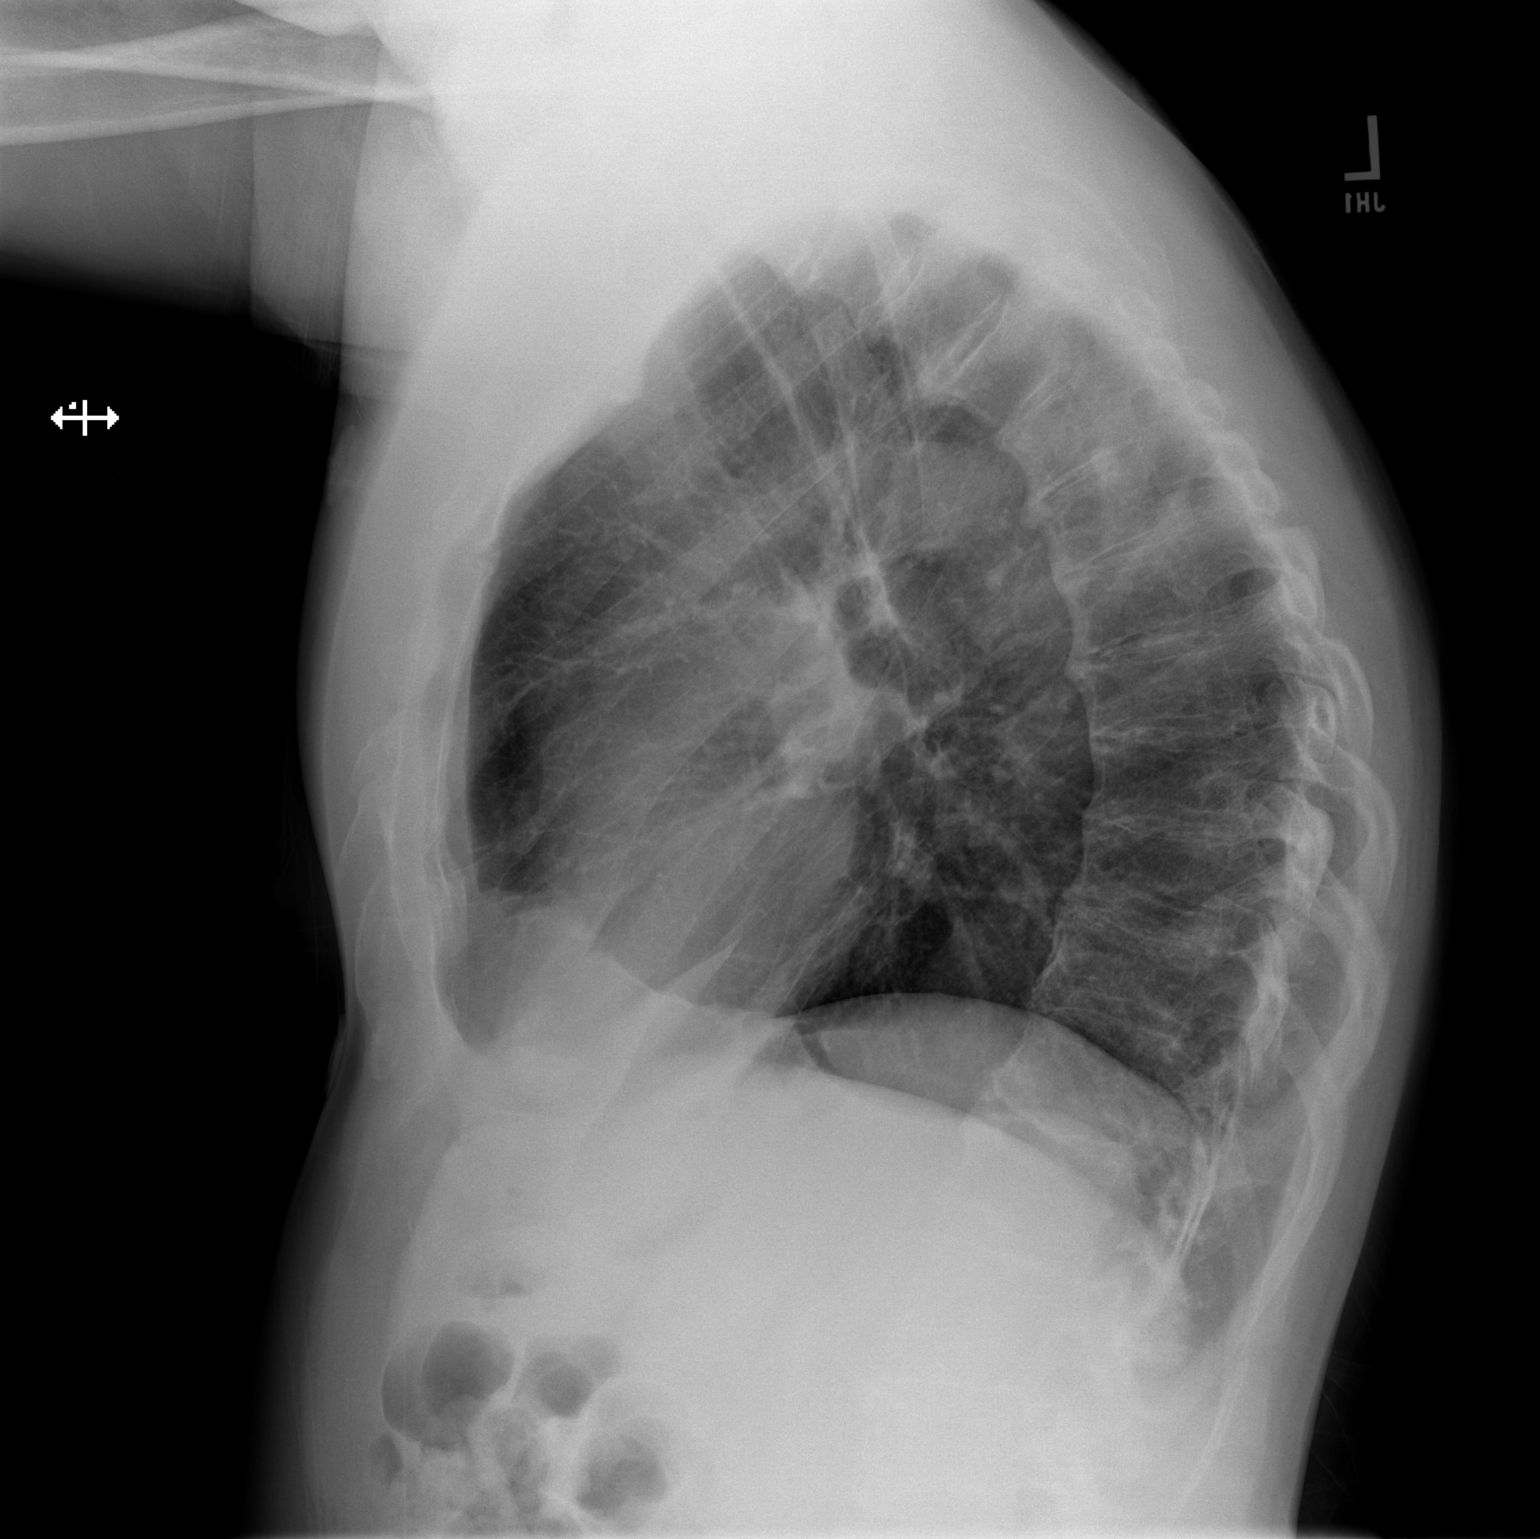

[2 of 2 positions shown; findings below may reference images not displayed]

FINDINGS: The heart size and mediastinal contours are within normal limits.
Both lungs are clear. Multilevel degenerative changes seen
throughout the thoracic spine.
IMPRESSION: No active cardiopulmonary disease.

## 2022-08-18 ENCOUNTER — Encounter (HOSPITAL_BASED_OUTPATIENT_CLINIC_OR_DEPARTMENT_OTHER): Payer: Self-pay

## 2022-08-18 NOTE — Telephone Encounter (Signed)
Please advise 

## 2022-09-21 ENCOUNTER — Other Ambulatory Visit: Payer: Self-pay | Admitting: Family Medicine

## 2022-09-21 DIAGNOSIS — M160 Bilateral primary osteoarthritis of hip: Secondary | ICD-10-CM

## 2022-09-21 DIAGNOSIS — M25551 Pain in right hip: Secondary | ICD-10-CM

## 2022-09-23 IMAGING — CT CT CARDIAC CORONARY ARTERY CALCIUM SCORE
3 series · 14 of 20 positions shown, 16 images · non-contrast
Comparison: None.

Addendum:
CLINICAL DATA: Risk stratification

EXAM:
Coronary Calcium Score
TECHNIQUE: The patient was scanned on a Siemens Somatom 64 slice scanner. Axial
non-contrast 3mm slices were carried out through the heart. The data
set was analyzed on a dedicated work station and scored using the
Agatson method.

[Series 2: cascseq 2.0 sa36 70% (id) · axial · 0.39mm/px · z∈[-244,-154]mm · 4 of 76 slices shown]
[im 16/76  vessel]
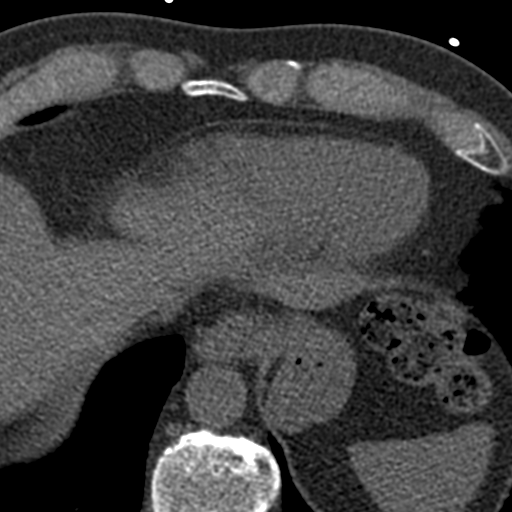
[im 31/76  vessel]
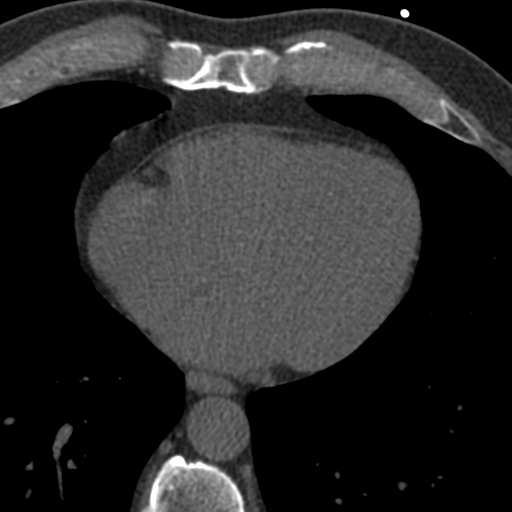
[im 46/76  vessel]
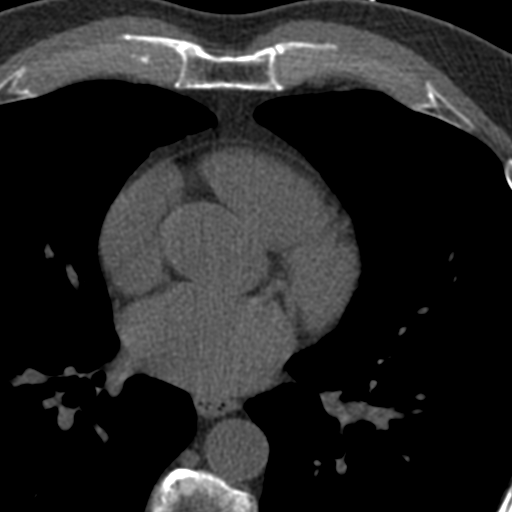
[im 61/76  vessel]
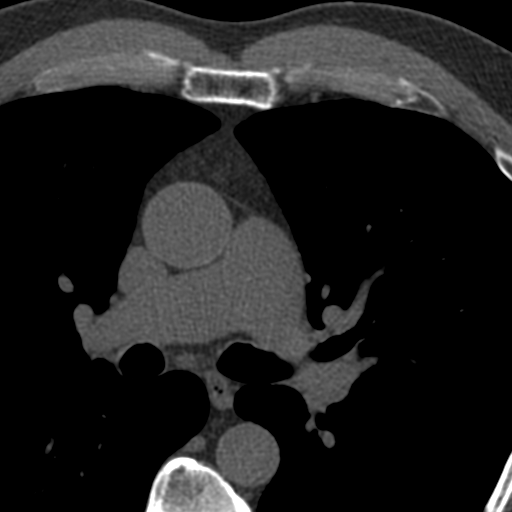

[Series 3: cascseq 2.0 bf37 st · axial · 0.73mm/px · z∈[-250,-150]mm · 5 of 76 slices shown, 7 images]
[im 13/76  vessel]
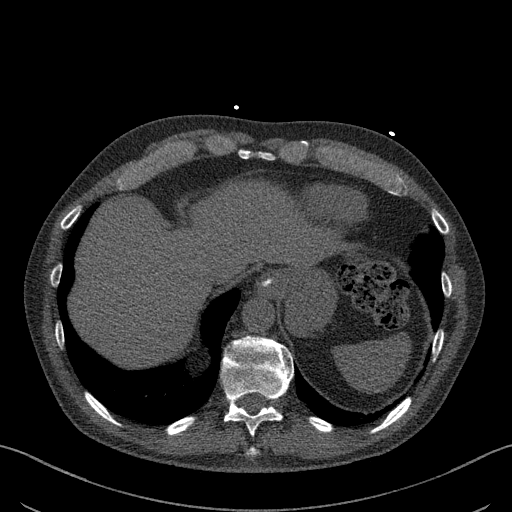
[im 13/76  lung]
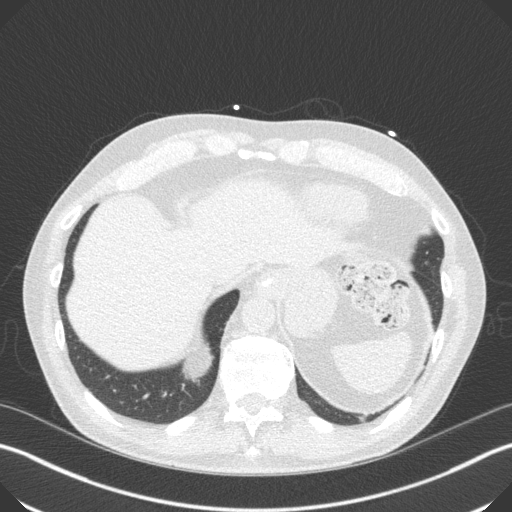
[im 26/76  vessel]
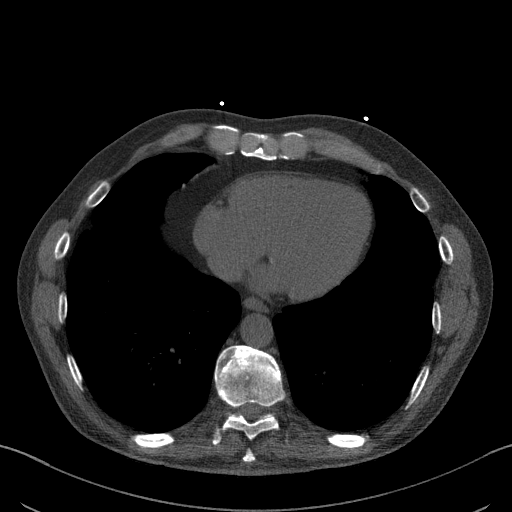
[im 38/76  vessel]
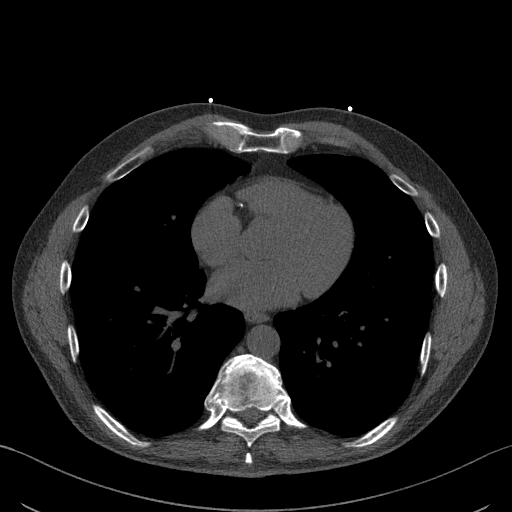
[im 51/76  vessel]
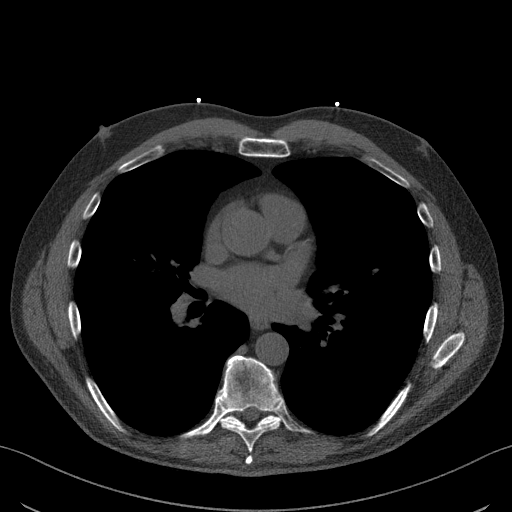
[im 63/76  vessel]
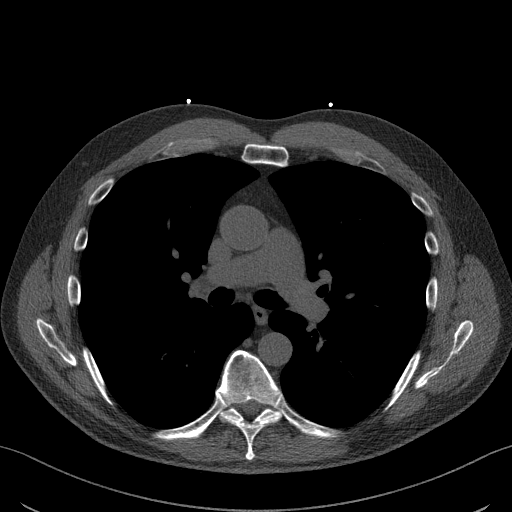
[im 63/76  lung]
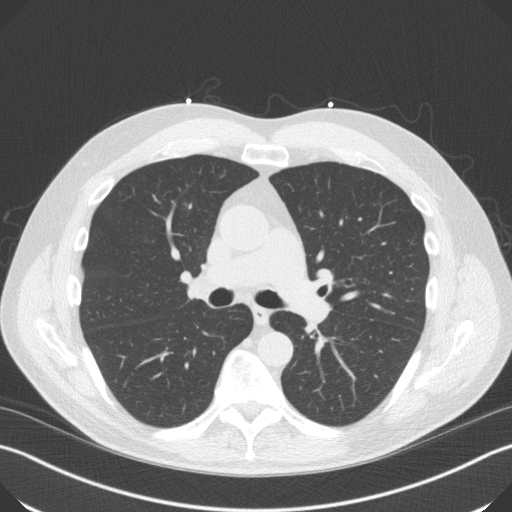

[Series 4: cascseq 2.0 br59 lung · axial · 0.73mm/px · z∈[-250,-150]mm · 5 of 76 slices shown]
[im 13/76  lung]
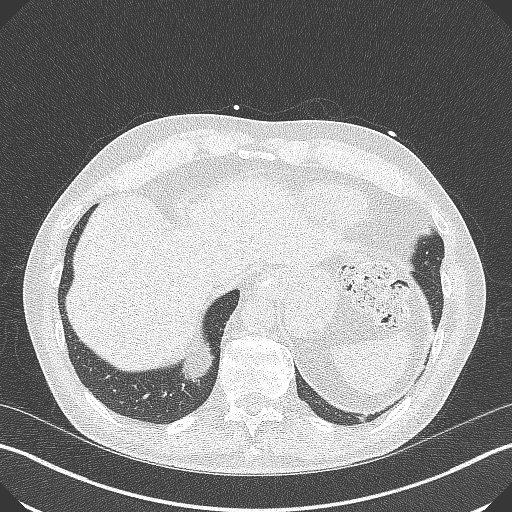
[im 26/76  lung]
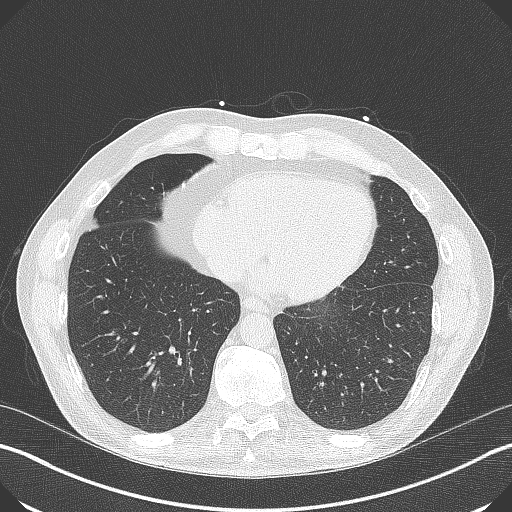
[im 38/76  lung]
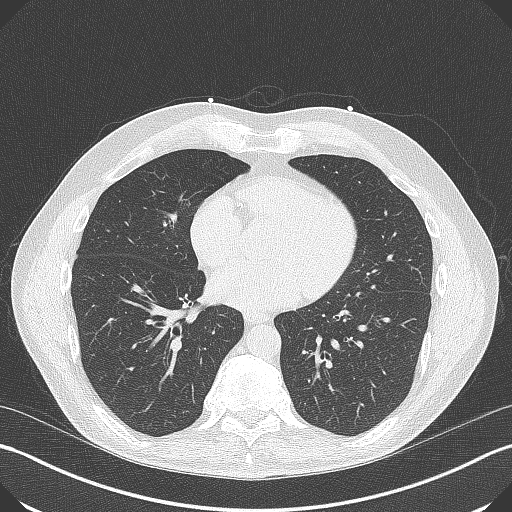
[im 51/76  lung]
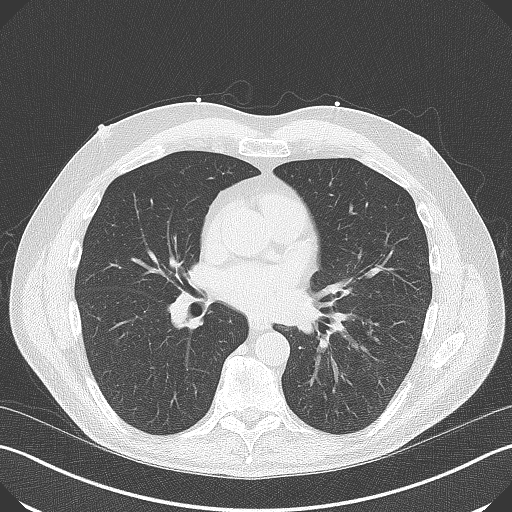
[im 63/76  lung]
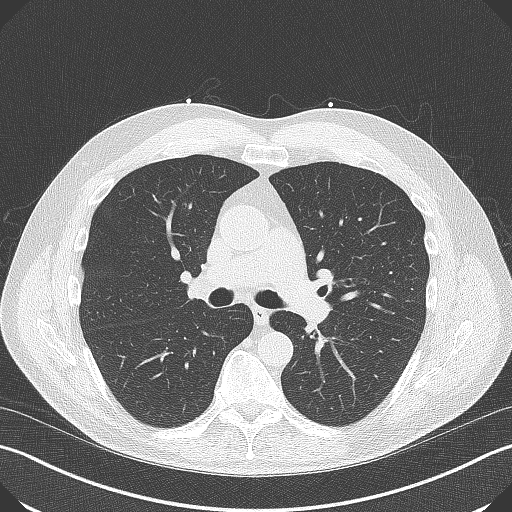

[14 of 20 positions shown; findings below may reference images not displayed]

FINDINGS: Non-cardiac: No significant non cardiac findings on limited lung and
soft tissue windows. See separate report from [REDACTED].

Ascending Aorta: Normal diameter 3.4 cm

Pericardium: Normal

Coronary arteries: Calcium noted in RCA and LAD

LM: 0

LAD 201

RCA: 24

LCX: 0

Total: 225
IMPRESSION: Coronary calcium score of 225. This was 86 th percentile for age and
sex matched control.

Letz Dior

EXAM:
OVER-READ INTERPRETATION  CT CHEST

The following report is an over-read performed by radiologist Dr.
does not include interpretation of cardiac or coronary anatomy or
pathology. The coronary calcium score interpretation by the
cardiologist is attached.
FINDINGS: Visualized mediastinal structures are normal. Images of the upper
abdomen are unremarkable. Visualized lungs are clear. Tiny densities
along the left major fissure probably represent small fissural
nodes. No pleural effusions. Extensive degenerative changes in the
thoracic spine with bridging osteophytes.
IMPRESSION: No acute extracardiac findings.

*** End of Addendum ***
FINDINGS: Non-cardiac: No significant non cardiac findings on limited lung and
soft tissue windows. See separate report from [REDACTED].

Ascending Aorta: Normal diameter 3.4 cm

Pericardium: Normal

Coronary arteries: Calcium noted in RCA and LAD

LM: 0

LAD 201

RCA: 24

LCX: 0

Total: 225
IMPRESSION: Coronary calcium score of 225. This was 86 th percentile for age and
sex matched control.

Letz Dior

## 2022-12-21 ENCOUNTER — Telehealth: Payer: Self-pay | Admitting: Family Medicine

## 2022-12-21 DIAGNOSIS — M25551 Pain in right hip: Secondary | ICD-10-CM

## 2022-12-21 DIAGNOSIS — M160 Bilateral primary osteoarthritis of hip: Secondary | ICD-10-CM

## 2022-12-21 NOTE — Telephone Encounter (Signed)
Prescription Request  12/21/2022  LOV: 07/08/2022  What is the name of the medication or equipment? meloxicam (MOBIC) 15 MG tablet   Have you contacted your pharmacy to request a refill? Yes   Which pharmacy would you like this sent to?  [id number] - [place], [place] - [address] AT [facility] OF [address] [place] [address] Phone: [phone] Fax: [phone]    Patient notified that their request is being sent to the clinical staff for review and that they should receive a response within 2 business days.   Please advise at Mobile [phone] (mobile)

## 2022-12-22 MED ORDER — MELOXICAM 15 MG PO TABS: ORAL_TABLET | ORAL | 0 refills | Status: DC

## 2022-12-22 NOTE — Telephone Encounter (Signed)
Not been filed since July.. ok to refill?

## 2022-12-22 NOTE — Telephone Encounter (Signed)
Rx sent, LM on pt VM with [facility] number requesting he call them for appt

## 2023-03-26 ENCOUNTER — Other Ambulatory Visit: Payer: Self-pay | Admitting: Cardiology

## 2023-03-26 DIAGNOSIS — R002 Palpitations: Secondary | ICD-10-CM

## 2023-03-30 ENCOUNTER — Other Ambulatory Visit: Payer: Self-pay | Admitting: Family Medicine

## 2023-03-30 DIAGNOSIS — M160 Bilateral primary osteoarthritis of hip: Secondary | ICD-10-CM

## 2023-03-30 DIAGNOSIS — M25551 Pain in right hip: Secondary | ICD-10-CM

## 2023-03-30 NOTE — Telephone Encounter (Signed)
 Last Fill: 12/22/22  Last OV: 07/08/22 Next OV: None Scheduled  Routing to provider for review/authorization.

## 2023-03-30 NOTE — Telephone Encounter (Signed)
 Copied from CRM [id number]. Topic: Clinical - Medication Refill >> Mar 30, 2023 10:42 AM Evie B wrote: Most Recent Primary Care Visit:  Provider: LENDIA BOBY CROME  Department: [facility]  Visit Type: OFFICE VISIT  Date: 07/08/2022  Medication: meloxicam  (MOBIC ) 15 MG tablet   Requesting a 90 day supply  Has the patient contacted their pharmacy? Yes (Agent: If no, request that the patient contact the pharmacy for the refill. If patient does not wish to contact the pharmacy document the reason why and proceed with request.) (Agent: If yes, when and what did the pharmacy advise?)  Is this the correct pharmacy for this prescription? Yes If no, delete pharmacy and type the correct one.  This is the patient's preferred pharmacy:  [id number] - [place], [place] - [address] AT [facility] OF [address] [place] [address] Phone: [phone] Fax: [phone]   Has the prescription been filled recently? Yes  Is the patient out of the medication? Yes  Has the patient been seen for an appointment in the last year OR does the patient have an upcoming appointment? Yes  Can we respond through MyChart? Yes  Agent: Please be advised that Rx refills may take up to 3 business days. We ask that you follow-up with your pharmacy.

## 2023-03-31 MED ORDER — MELOXICAM 15 MG PO TABS: ORAL_TABLET | ORAL | 0 refills | Status: DC

## 2023-03-31 NOTE — Telephone Encounter (Signed)
 Pt did f/u with ortho last year and they stated Treatment options explained and he will think about doing an intra-articular injection with Dr. Burnetta.  For now he will just take meloxicam .  He understands ultimately he is facing hip replacement.   Are you ok w giving him a refill? Hasn't been refilled since 12/22/22

## 2023-06-08 ENCOUNTER — Other Ambulatory Visit: Payer: Self-pay | Admitting: Family Medicine

## 2023-06-08 DIAGNOSIS — M25552 Pain in left hip: Secondary | ICD-10-CM

## 2023-06-08 DIAGNOSIS — M160 Bilateral primary osteoarthritis of hip: Secondary | ICD-10-CM

## 2023-06-13 NOTE — Telephone Encounter (Signed)
 Copied from CRM [id number]. Topic: Clinical - Prescription Issue >> Jun 10, 2023  2:27 PM Deaijah H wrote: Reason for CRM: Patient called in to have his meloxicam  (MOBIC ) 15 MG tablet refilled advised before refilling he will need an appointment patient said "no that's ridiculous" and that he don't need an appointment. Advised patient due to him not being seen since 02/2022 an appointment is needed. Patient asked if Dr. Maree Shames or her nurse may give him a call. Please call [phone]

## 2023-06-16 ENCOUNTER — Encounter: Payer: Self-pay | Admitting: Family Medicine

## 2023-06-16 ENCOUNTER — Ambulatory Visit (INDEPENDENT_AMBULATORY_CARE_PROVIDER_SITE_OTHER): Payer: Self-pay | Admitting: Family Medicine

## 2023-06-16 VITALS — BP 116/82 | HR 80 | Temp 97.8°F | Ht 69.0 in | Wt 147.0 lb

## 2023-06-16 DIAGNOSIS — R634 Abnormal weight loss: Secondary | ICD-10-CM

## 2023-06-16 DIAGNOSIS — I10 Essential (primary) hypertension: Secondary | ICD-10-CM

## 2023-06-16 DIAGNOSIS — M25551 Pain in right hip: Secondary | ICD-10-CM

## 2023-06-16 DIAGNOSIS — M160 Bilateral primary osteoarthritis of hip: Secondary | ICD-10-CM

## 2023-06-16 DIAGNOSIS — M25552 Pain in left hip: Secondary | ICD-10-CM

## 2023-06-16 LAB — CBC WITH DIFFERENTIAL/PLATELET
Basophils Absolute: 0 10*3/uL (ref 0.0–0.1)
Basophils Relative: 0.5 % (ref 0.0–3.0)
Eosinophils Absolute: 0.3 10*3/uL (ref 0.0–0.7)
Eosinophils Relative: 3.9 % (ref 0.0–5.0)
HCT: 42.1 % (ref 39.0–52.0)
Hemoglobin: 14.5 g/dL (ref 13.0–17.0)
Lymphocytes Relative: 35.5 % (ref 12.0–46.0)
Lymphs Abs: 2.4 10*3/uL (ref 0.7–4.0)
MCHC: 34.4 g/dL (ref 30.0–36.0)
MCV: 92.8 fl (ref 78.0–100.0)
Monocytes Absolute: 0.5 10*3/uL (ref 0.1–1.0)
Monocytes Relative: 7.2 % (ref 3.0–12.0)
Neutro Abs: 3.5 10*3/uL (ref 1.4–7.7)
Neutrophils Relative %: 52.9 % (ref 43.0–77.0)
Platelets: 132 10*3/uL — ABNORMAL LOW (ref 150.0–400.0)
RBC: 4.54 Mil/uL (ref 4.22–5.81)
RDW: 12.5 % (ref 11.5–15.5)
WBC: 6.7 10*3/uL (ref 4.0–10.5)

## 2023-06-16 LAB — COMPREHENSIVE METABOLIC PANEL WITH GFR
ALT: 23 U/L (ref 0–53)
AST: 26 U/L (ref 0–37)
Albumin: 4.6 g/dL (ref 3.5–5.2)
Alkaline Phosphatase: 56 U/L (ref 39–117)
BUN: 19 mg/dL (ref 6–23)
CO2: 30 meq/L (ref 19–32)
Calcium: 9.9 mg/dL (ref 8.4–10.5)
Chloride: 102 meq/L (ref 96–112)
Creatinine, Ser: 0.72 mg/dL (ref 0.40–1.50)
GFR: 99.59 mL/min (ref >60.00)
Glucose, Bld: 88 mg/dL (ref 70–99)
Potassium: 4.5 meq/L (ref 3.5–5.1)
Sodium: 139 meq/L (ref 135–145)
Total Bilirubin: 0.9 mg/dL (ref 0.2–1.2)
Total Protein: 7.4 g/dL (ref 6.0–8.3)

## 2023-06-16 LAB — T4, FREE: Free T4: 1.05 ng/dL (ref 0.60–1.60)

## 2023-06-16 LAB — TSH: TSH: 3.38 u[IU]/mL (ref 0.35–5.50)

## 2023-06-16 MED ORDER — MELOXICAM 15 MG PO TABS: ORAL_TABLET | ORAL | 0 refills | Status: AC

## 2023-06-16 NOTE — Progress Notes (Signed)
 Subjective:     Patient ID: Alan Fuentes, male    DOB: [birth year], 58 y.o.   MRN: [id number]  Chief Complaint  Patient presents with   Medication Refill    Medication Refill Pertinent negatives include no abdominal pain, chest pain, chills, fever, headaches, nausea or vomiting.   History of Present Illness         He is here requesting refill of meloxicam . States he takes only a 1/3 to 1/2 per day for hip pain.  States he saw orthopedist for hip pain.  States they recommended injections.  He has not done these.  States he is drinks "dose" to help with his liver.  Reports appointment with cardiology in August.  Vegan diet. He has lost weight and feels better overall.  Widowed.   Works as Archivist Due  Topic Date Due   HIV Screening  Never done   Hepatitis C Screening  Never done   DTaP/Tdap/Td (1 - Tdap) Never done   Zoster Vaccines- Shingrix (1 of 2) Never done   Colonoscopy  Never done    Past Medical History:  Diagnosis Date   Hernia, inguinal    History of asthma    as a child    History reviewed. No pertinent surgical history.  Family History  Problem Relation Age of Onset   Valvular heart disease Mother    Heart attack Father    Rheumatic fever Father    Congestive Heart Failure Father    Lung cancer Maternal Grandmother     Social History   Socioeconomic History   Marital status: Single    Spouse name: Not on file   Number of children: Not on file   Years of education: Not on file   Highest education level: Associate degree: occupational, Scientist, product/process development, or vocational program  Occupational History   Not on file  Tobacco Use   Smoking status: Never   Smokeless tobacco: Never  Substance and Sexual Activity   Alcohol use: Never   Drug use: Not Currently    Types: Marijuana   Sexual activity: Not on file  Other Topics Concern   Not on file  Social History Narrative   ** Merged History Encounter **        Social Drivers of Health   Financial Resource Strain: Medium Risk (07/07/2022)   Overall Financial Resource Strain (CARDIA)    Difficulty of Paying Living Expenses: Somewhat hard  Food Insecurity: Patient Declined (06/12/2023)   Hunger Vital Sign    Worried About Running Out of Food in the Last Year: Patient declined    Ran Out of Food in the Last Year: Patient declined  Transportation Needs: No Transportation Needs (06/12/2023)   PRAPARE - Administrator, Civil Service (Medical): No    Lack of Transportation (Non-Medical): No  Physical Activity: Sufficiently Active (06/12/2023)   Exercise Vital Sign    Days of Exercise per Week: 6 days    Minutes of Exercise per Session: 30 min  Stress: Stress Concern Present (06/12/2023)   [employer] of Occupational Health - Occupational Stress Questionnaire    Feeling of Stress : To some extent  Social Connections: Moderately Isolated (06/12/2023)   Social Connection and Isolation Panel [NHANES]    Frequency of Communication with Friends and Family: More than three times a week    Frequency of Social Gatherings with Friends and Family: Once a week    Attends Religious Services: 1 to 4  times per year    Active Member of Clubs or Organizations: No    Attends Banker Meetings: Not on file    Marital Status: Widowed  Intimate Partner Violence: Not on file    Outpatient Medications Prior to Visit  Medication Sig Dispense Refill   aspirin  EC 81 MG tablet Take 1 tablet (81 mg total) by mouth daily. Swallow whole. 90 tablet 3   metoprolol  succinate (TOPROL -XL) 25 MG 24 hr tablet TAKE 1 TABLET(25 MG) BY MOUTH DAILY WITH OR IMMEDIATELY FOLLOWING A MEAL 30 tablet 0   polyethylene glycol powder (MIRALAX ) 17 GM/SCOOP powder Please start taking 1 capful 3 times a day for 2-3 days. Slowly cut back as needed until you have normal bowel movements. 255 g 0   propranolol  (INDERAL ) 20 MG tablet TAKE 1 TABLET(20 MG) BY MOUTH THREE TIMES  DAILY AS NEEDED 270 tablet 1   rosuvastatin  (CRESTOR ) 20 MG tablet TAKE 1 TABLET(20 MG) BY MOUTH DAILY 90 tablet 3   meloxicam  (MOBIC ) 15 MG tablet TAKE 1 TABLET(15 MG) BY MOUTH DAILY 30 tablet 0   No facility-administered medications prior to visit.    Allergies  Allergen Reactions   Molds & Smuts Anaphylaxis, Hives, Itching and Shortness Of Breath   Pollen Extract-Tree Extract [Pollen Extract] Swelling    Facial and throat swelling    Review of Systems  Constitutional:  Positive for weight loss. Negative for chills, fever and malaise/fatigue.       Reports due to vegan diet   Eyes:  Negative for blurred vision, double vision and photophobia.  Respiratory:  Negative for shortness of breath.   Cardiovascular:  Negative for chest pain, palpitations and leg swelling.  Gastrointestinal:  Negative for abdominal pain, blood in stool, constipation, diarrhea, nausea and vomiting.  Genitourinary:  Negative for dysuria, frequency and urgency.  Musculoskeletal:  Positive for joint pain.  Neurological:  Negative for dizziness, tingling, focal weakness and headaches.  Psychiatric/Behavioral:  Negative for substance abuse and suicidal ideas. The patient is nervous/anxious and has insomnia.        Objective:    Physical Exam Constitutional:      General: He is not in acute distress.    Appearance: He is not ill-appearing.  HENT:     Mouth/Throat:     Mouth: Mucous membranes are moist.  Eyes:     Extraocular Movements: Extraocular movements intact.     Conjunctiva/sclera: Conjunctivae normal.  Cardiovascular:     Rate and Rhythm: Normal rate and regular rhythm.  Pulmonary:     Effort: Pulmonary effort is normal.     Breath sounds: Normal breath sounds.  Musculoskeletal:     Cervical back: Normal range of motion and neck supple. No tenderness.     Right lower leg: No edema.     Left lower leg: No edema.  Lymphadenopathy:     Cervical: No cervical adenopathy.  Skin:    General:  Skin is warm and dry.  Neurological:     General: No focal deficit present.     Mental Status: He is alert and oriented to person, place, and time.     Motor: No weakness.     Coordination: Coordination normal.     Gait: Gait abnormal.  Psychiatric:        Mood and Affect: Mood normal.        Behavior: Behavior normal.        Thought Content: Thought content normal.      BP  116/82 (BP Location: Left Arm, Patient Position: Sitting)   Pulse 80   Temp 97.8 F (36.6 C) (Temporal)   Ht 5\' 9"  (1.753 m)   Wt 147 lb (66.7 kg)   SpO2 100%   BMI 21.71 kg/m  Wt Readings from Last 3 Encounters:  06/16/23 147 lb (66.7 kg)  07/08/22 161 lb (73 kg)  03/01/22 145 lb (65.8 kg)       Assessment & Plan:   Problem List Items Addressed This Visit     Essential hypertension - Primary   Relevant Orders   CBC with Differential/Platelet   Comprehensive metabolic panel with GFR   TSH   T4, free   Other Visit Diagnoses       Arthritis of both hips       Relevant Medications   meloxicam  (MOBIC ) 15 MG tablet     Bilateral hip pain       Relevant Medications   meloxicam  (MOBIC ) 15 MG tablet     Weight loss       Relevant Orders   CBC with Differential/Platelet   Comprehensive metabolic panel with GFR   TSH   T4, free      He is here today requesting a refill of meloxicam  for chronic hip pain.  He saw the orthopedist who recommended hip injections.  He has not been these but is considering them.  He is only taking 1/3-1/2 meloxicam  daily.  Discussed trying Tylenol and other symptomatic management for pain and reduce NSAID intake if possible. Refilled meloxicam .  Will check labs including renal function. She has lost weight but reports due to diet.  Check thyroid  function. Encouraged him to complete the Cologuard kit to screen for colon cancer.  He has it at home. He is working as a Product/process development scientist. Appointment to see cardiologist in August. He is not fasting today.  Lipids were  not checked.  I am having Avel Leiter maintain his aspirin  EC, polyethylene glycol powder, rosuvastatin , propranolol , metoprolol  succinate, and meloxicam .  Meds ordered this encounter  Medications   meloxicam  (MOBIC ) 15 MG tablet    Sig: TAKE 1 TABLET(15 MG) BY MOUTH DAILY    Dispense:  90 tablet    Refill:  0

## 2023-06-16 NOTE — Patient Instructions (Signed)
 Please go downstairs for labs.   Try Tylenol 500 mg for hip pain and see if you can use less meloxicam .   Consider following up with the orthopedist for hip injections.   Remember to do the Cologuard test to screen for sleep apnea.

## 2023-06-27 ENCOUNTER — Other Ambulatory Visit: Payer: Self-pay | Admitting: Family

## 2023-06-27 ENCOUNTER — Encounter (HOSPITAL_BASED_OUTPATIENT_CLINIC_OR_DEPARTMENT_OTHER): Payer: Self-pay

## 2023-06-27 DIAGNOSIS — I2510 Atherosclerotic heart disease of native coronary artery without angina pectoris: Secondary | ICD-10-CM

## 2023-06-27 MED ORDER — ROSUVASTATIN CALCIUM 20 MG PO TABS: ORAL_TABLET | ORAL | 0 refills | Status: DC

## 2023-10-07 ENCOUNTER — Ambulatory Visit (INDEPENDENT_AMBULATORY_CARE_PROVIDER_SITE_OTHER): Payer: Self-pay | Admitting: Cardiology

## 2023-10-07 ENCOUNTER — Encounter (HOSPITAL_BASED_OUTPATIENT_CLINIC_OR_DEPARTMENT_OTHER): Payer: Self-pay | Admitting: Cardiology

## 2023-10-07 VITALS — BP 127/86 | HR 72 | Ht 69.0 in | Wt 148.4 lb

## 2023-10-07 DIAGNOSIS — Z8249 Family history of ischemic heart disease and other diseases of the circulatory system: Secondary | ICD-10-CM

## 2023-10-07 DIAGNOSIS — I2510 Atherosclerotic heart disease of native coronary artery without angina pectoris: Secondary | ICD-10-CM

## 2023-10-07 DIAGNOSIS — R030 Elevated blood-pressure reading, without diagnosis of hypertension: Secondary | ICD-10-CM

## 2023-10-07 DIAGNOSIS — R002 Palpitations: Secondary | ICD-10-CM

## 2023-10-07 DIAGNOSIS — E7800 Pure hypercholesterolemia, unspecified: Secondary | ICD-10-CM

## 2023-10-07 DIAGNOSIS — I25118 Atherosclerotic heart disease of native coronary artery with other forms of angina pectoris: Secondary | ICD-10-CM

## 2023-10-07 DIAGNOSIS — I10 Essential (primary) hypertension: Secondary | ICD-10-CM

## 2023-10-07 MED ORDER — ROSUVASTATIN CALCIUM 20 MG PO TABS: ORAL_TABLET | ORAL | 3 refills | Status: DC

## 2023-10-07 NOTE — Progress Notes (Signed)
 Cardiology Office Note:  .   Date:  10/07/2023  ID:  Alan Fuentes, DOB 10/08/1965, MRN [id number] PCP: Lendia Boby CROME, NP-C  [facility] Providers Cardiologist:  Shelda Bruckner, MD {  History of Present Illness: .   Alan Fuentes is a 58 y.o. male with a hx of asthma, coronary calcification consistent with nonobstructive CAD, palpitations who is seen for follow-up. I initially met him 09/09/2020 for the evaluation and management of palpitations.   Known white coat syndrome in clinic.    CV history: Had sharp midsternal CP in 2023, coronary CTA revealed mild nonobstructive disease, not felt to be significant enough to cause symptoms. He was recommended for secondary prevention with continued aspirin  and rosuvastatin .  Today: Doing very well. No chest pain recently. Has hip pain, now on meloxicam . Still able to bike daily. Trying to avoid surgery as long as possible.  Palpitations are well controlled, has gradually cut back on metoprolol  and is now not routinely taking it. Blood pressure has also been good at home, 110s/70s. Has lost some weight/muscle, trying to work on ways to maintain this with diet and exercise. Declined testosterone.  ROS: Denies chest pain, shortness of breath at rest or with normal exertion. No PND, orthopnea, LE edema or unexpected weight gain. No syncope or palpitations. ROS otherwise negative except as noted.   Studies Reviewed: SABRA    EKG:  EKG Interpretation Date/Time:  Friday October 07 2023 09:09:11 EDT Ventricular Rate:  72 PR Interval:  136 QRS Duration:  80 QT Interval:  278 QTC Calculation: 304 R Axis:   70  Text Interpretation: Normal sinus rhythm Nonspecific T wave abnormality Confirmed by Bruckner Shelda [phone]) on 10/07/2023 9:43:31 AM    Physical Exam:   VS:  BP 127/86   Pulse 72   Ht 5' 9 (1.753 m)   Wt 148 lb 6.4 oz (67.3 kg)   SpO2 96%   BMI 21.91 kg/m    Wt Readings from Last 3 Encounters:  10/07/23 148 lb 6.4  oz (67.3 kg)  06/16/23 147 lb (66.7 kg)  07/08/22 161 lb (73 kg)    GEN: Well nourished, well developed in no acute distress HEENT: Normal, moist mucous membranes NECK: No JVD CARDIAC: regular rhythm, normal S1 and S2, no rubs or gallops. No murmur. VASCULAR: Radial and DP pulses 2+ bilaterally. No carotid bruits RESPIRATORY:  Clear to auscultation without rales, wheezing or rhonchi  ABDOMEN: Soft, non-tender, non-distended MUSCULOSKELETAL:  Ambulates independently SKIN: Warm and dry, no edema NEUROLOGIC:  Alert and oriented x 3. No focal neuro deficits noted. PSYCHIATRIC:  Normal affect    ASSESSMENT AND PLAN: .    Palpitations: -self-weaned off metoprolol , discontinued today. Has not had recent symptoms. Has propranolol  PRN, has not required in a long time -see prior discussion re: holding on further evaluation   White coat hypertension -readings at home consistent and excellent to borderline low -would use home readings to guide therapy   Coronary calcium , nonobstructive CAD family history of heart disease Hypercholesterolemia -lp(a) normal -Calcium  score 225 2022 -coronary CT with nonobstructive disease -he has worked hard on diet and exercise. Plant based diet, increased exercise. -LDL goal <70, drastically improved on most recent labs 01/08/21: Tchol 75, TG 74, HDL 25, LDL 34. Prior to initiation of statin, Tchol 205, TG 140, HDL 42, LDL 138 -due for recheck, ordered today -continue rosuvastatin  20 mg daily -continue aspirin  81 mg daily -continue excellent lifestyle  CV risk counseling and prevention -recommend heart  healthy/Mediterranean diet, with whole grains, fruits, vegetable, fish, lean meats, nuts, and olive oil. Limit salt. -recommend moderate walking, 3-5 times/week for 30-50 minutes each session. Aim for at least 150 minutes.week. Goal should be pace of 3 miles/hours, or walking 1.5 miles in 30 minutes -recommend avoidance of tobacco products. Avoid excess  alcohol.  Dispo: 1 year or sooner as needed  Signed, Shelda Bruckner, MD   Shelda Bruckner, MD, PhD, [facility] [place]  [facility]  [place]  [facility] & V[facility] at [facility] [address], [address] [place], [place] [address] [phone]

## 2023-10-07 NOTE — Patient Instructions (Signed)
 Medication Instructions:   Your physician recommends that you continue on your current medications as directed. Please refer to the Current Medication list given to you today.  *If you need a refill on your cardiac medications before your next appointment, please call your pharmacy*   Lab Work:  ANYTIME SOON IN THE NEAR FUTURE--LIPIDS--PLEASE COME FASTING TO THIS LAB APPOINTMENT  If you have labs (blood work) drawn today and your tests are completely normal, you will receive your results only by: MyChart Message (if you have MyChart) OR A paper copy in the mail If you have any lab test that is abnormal or we need to change your treatment, we will call you to review the results.    Follow-Up: At [facility], you and your health needs are our priority.  As part of our continuing mission to provide you with exceptional heart care, our providers are all part of one team.  This team includes your primary Cardiologist (physician) and Advanced Practice Providers or APPs (Physician Assistants and Nurse Practitioners) who all work together to provide you with the care you need, when you need it.  Your next appointment:   1 year(s)  Provider:   Shelda Bruckner, MD

## 2023-12-01 ENCOUNTER — Other Ambulatory Visit: Payer: Self-pay | Admitting: Cardiology

## 2023-12-01 DIAGNOSIS — I2510 Atherosclerotic heart disease of native coronary artery without angina pectoris: Secondary | ICD-10-CM

## 2023-12-22 ENCOUNTER — Other Ambulatory Visit: Payer: Self-pay | Admitting: Family Medicine

## 2023-12-22 DIAGNOSIS — M160 Bilateral primary osteoarthritis of hip: Secondary | ICD-10-CM

## 2023-12-22 DIAGNOSIS — M25551 Pain in right hip: Secondary | ICD-10-CM
